# Patient Record
Sex: Female | Born: 1975
Health system: Southern US, Community
[De-identification: ages and names within clinical notes are randomized; demographics above are authoritative.]

## PROBLEM LIST (undated history)

## (undated) DIAGNOSIS — R112 Nausea with vomiting, unspecified: Secondary | ICD-10-CM

## (undated) DIAGNOSIS — Z9289 Personal history of other medical treatment: Secondary | ICD-10-CM

## (undated) DIAGNOSIS — I4719 Other supraventricular tachycardia: Secondary | ICD-10-CM

## (undated) DIAGNOSIS — K219 Gastro-esophageal reflux disease without esophagitis: Secondary | ICD-10-CM

## (undated) DIAGNOSIS — Z9889 Other specified postprocedural states: Secondary | ICD-10-CM

## (undated) DIAGNOSIS — K254 Chronic or unspecified gastric ulcer with hemorrhage: Secondary | ICD-10-CM

## (undated) DIAGNOSIS — D649 Anemia, unspecified: Secondary | ICD-10-CM

## (undated) DIAGNOSIS — G43909 Migraine, unspecified, not intractable, without status migrainosus: Secondary | ICD-10-CM

## (undated) DIAGNOSIS — N390 Urinary tract infection, site not specified: Secondary | ICD-10-CM

## (undated) DIAGNOSIS — I10 Essential (primary) hypertension: Secondary | ICD-10-CM

## (undated) DIAGNOSIS — I471 Supraventricular tachycardia: Secondary | ICD-10-CM

## (undated) HISTORY — PX: SALPINGOOPHORECTOMY: SHX82

## (undated) HISTORY — PX: CARPAL TUNNEL RELEASE: SHX101

## (undated) HISTORY — DX: Essential (primary) hypertension: I10

---

## 1998-12-30 HISTORY — PX: PELVIC LAPAROSCOPY: SHX162

## 2001-12-30 HISTORY — PX: TUBAL LIGATION: SHX77

## 2001-12-30 HISTORY — PX: ROUX-EN-Y GASTRIC BYPASS: SHX1104

## 2002-11-29 DIAGNOSIS — K254 Chronic or unspecified gastric ulcer with hemorrhage: Secondary | ICD-10-CM

## 2002-11-29 DIAGNOSIS — Z9289 Personal history of other medical treatment: Secondary | ICD-10-CM

## 2002-11-29 HISTORY — DX: Chronic or unspecified gastric ulcer with hemorrhage: K25.4

## 2002-11-29 HISTORY — DX: Personal history of other medical treatment: Z92.89

## 2004-04-13 ENCOUNTER — Other Ambulatory Visit: Admission: RE | Admit: 2004-04-13 | Discharge: 2004-04-13 | Payer: Self-pay | Admitting: Family Medicine

## 2005-04-17 ENCOUNTER — Other Ambulatory Visit: Admission: RE | Admit: 2005-04-17 | Discharge: 2005-04-17 | Payer: Self-pay | Admitting: Family Medicine

## 2005-06-27 ENCOUNTER — Emergency Department (HOSPITAL_COMMUNITY): Admission: EM | Admit: 2005-06-27 | Discharge: 2005-06-27 | Payer: Self-pay | Admitting: Emergency Medicine

## 2005-07-19 ENCOUNTER — Encounter (INDEPENDENT_AMBULATORY_CARE_PROVIDER_SITE_OTHER): Payer: Self-pay | Admitting: *Deleted

## 2005-07-19 ENCOUNTER — Ambulatory Visit (HOSPITAL_BASED_OUTPATIENT_CLINIC_OR_DEPARTMENT_OTHER): Admission: RE | Admit: 2005-07-19 | Discharge: 2005-07-19 | Payer: Self-pay | Admitting: Gynecology

## 2005-12-30 HISTORY — PX: VAGINAL HYSTERECTOMY: SUR661

## 2006-05-05 ENCOUNTER — Other Ambulatory Visit: Admission: RE | Admit: 2006-05-05 | Discharge: 2006-05-05 | Payer: Self-pay | Admitting: Gynecology

## 2006-07-03 ENCOUNTER — Encounter (INDEPENDENT_AMBULATORY_CARE_PROVIDER_SITE_OTHER): Payer: Self-pay | Admitting: Specialist

## 2006-07-03 ENCOUNTER — Ambulatory Visit (HOSPITAL_COMMUNITY): Admission: RE | Admit: 2006-07-03 | Discharge: 2006-07-04 | Payer: Self-pay | Admitting: Gynecology

## 2007-07-14 ENCOUNTER — Other Ambulatory Visit: Admission: RE | Admit: 2007-07-14 | Discharge: 2007-07-14 | Payer: Self-pay | Admitting: Gynecology

## 2008-08-03 ENCOUNTER — Ambulatory Visit (HOSPITAL_BASED_OUTPATIENT_CLINIC_OR_DEPARTMENT_OTHER): Admission: RE | Admit: 2008-08-03 | Discharge: 2008-08-03 | Payer: Self-pay | Admitting: Gynecology

## 2008-08-03 ENCOUNTER — Encounter: Payer: Self-pay | Admitting: Gynecology

## 2008-08-06 ENCOUNTER — Inpatient Hospital Stay (HOSPITAL_COMMUNITY): Admission: AD | Admit: 2008-08-06 | Discharge: 2008-08-06 | Payer: Self-pay | Admitting: Obstetrics & Gynecology

## 2008-11-15 ENCOUNTER — Encounter: Payer: Self-pay | Admitting: Gynecology

## 2008-11-15 ENCOUNTER — Ambulatory Visit: Payer: Self-pay | Admitting: Gynecology

## 2008-11-15 ENCOUNTER — Other Ambulatory Visit: Admission: RE | Admit: 2008-11-15 | Discharge: 2008-11-15 | Payer: Self-pay | Admitting: Gynecology

## 2009-11-29 ENCOUNTER — Encounter: Admission: RE | Admit: 2009-11-29 | Discharge: 2009-11-29 | Payer: Self-pay | Admitting: *Deleted

## 2009-12-12 ENCOUNTER — Other Ambulatory Visit: Admission: RE | Admit: 2009-12-12 | Discharge: 2009-12-12 | Payer: Self-pay | Admitting: Gynecology

## 2009-12-12 ENCOUNTER — Ambulatory Visit: Payer: Self-pay | Admitting: Gynecology

## 2010-10-25 ENCOUNTER — Ambulatory Visit: Payer: Self-pay | Admitting: Gynecology

## 2010-10-26 ENCOUNTER — Ambulatory Visit: Payer: Self-pay | Admitting: Gynecology

## 2010-12-13 ENCOUNTER — Other Ambulatory Visit
Admission: RE | Admit: 2010-12-13 | Discharge: 2010-12-13 | Payer: Self-pay | Source: Home / Self Care | Admitting: Gynecology

## 2010-12-13 ENCOUNTER — Ambulatory Visit: Payer: Self-pay | Admitting: Gynecology

## 2011-05-14 NOTE — H&P (Signed)
NAMEJAIRA, Latoya [name]            ACCOUNT NO.:  [id number]   MEDICAL RECORD NO.:  [id number]          PATIENT TYPE:  AMB   LOCATION:  [facility]                         FACILITY:  [facility]   PHYSICIAN:  Timothy P. Fontaine, M.D.DATE OF BIRTH:  21-Jun-1976   DATE OF ADMISSION:  08/03/2008  DATE OF DISCHARGE:                              HISTORY & PHYSICAL   CHIEF COMPLAINT:  Right lower quadrant pain.   HISTORY OF PRESENT ILLNESS:  A 35 year old G1, P44 female, status post  LAVH/LSO in the past,  separate laparoscopy for endometriosis with  persistent right pelvic pain.  Outpatient evaluation including  ultrasound shows cystic changes suggestive of endometriosis.  Options  for management were discussed to include observation, hormonal  manipulation to include suppressive therapy such as low-dose oral  contraceptives, Depo-Provera, Depo-Lupron, as well as surgical to  include laparoscopy,  ablation of visible endometriosis, conservative  removal of ovarian cystic areas but leaving normal-appearing ovarian  tissue versus proceeding with a laparoscopic oophorectomy.  The patient  wants to proceed with a laparoscopic oophorectomy.  She understands that  it is removing her only remaining ovary.  She understands and accepts  this and is admitted for laparoscopic oophorectomy.   PAST MEDICAL HISTORY:  None.   PAST SURGICAL HISTORY:  Includes gastric bypass in 2003, tubal  sterilization 2003, cesarean section, laparoscopy for endometriosis,  LAVH/LSO for endometriosis, carpal tunnel surgery.   ALLERGIES:  CODEINE CAUSES ITCHING.   CURRENT MEDICATIONS:  Nexium 40 mg daily, Zomig 25 mg p.r.n. headache,  multivitamins, iron.   REVIEW OF SYSTEMS:  Noncontributory.   FAMILY HISTORY:  Noncontributory.   SOCIAL HISTORY:  Significant for tobacco use.   ADMISSION PHYSICAL EXAMINATION:  VITAL SIGNS:  Afebrile.  Vital signs  are stable.  HEENT:  Normal.  LUNGS:  Clear.  CARDIAC:  Regular rate  without rubs, murmurs or gallops.  ABDOMINAL:  Exam benign.  PELVIC:  External, BUS, vagina normal.  Bimanual without masses or  tenderness.  Rectovaginal exam confirms.   ASSESSMENT:  A 35 year old G1, P51 female, status post laparoscopic-  assisted vaginal hysterectomy/left salpingoophorectomy for  endometriosis, persistent right-sided pain, ultrasound suggestive of  endometrioma measuring 25 x 22 mm for laparoscopic oophorectomy.  The  risks, benefits, indications, and alternatives for the surgery were  reviewed with the patient, to include attempts at conservative  management such as pain management, hormonal suppressive therapy, low-  dose oral contraceptives, Depo-Provera, Depo-Lupron, conservative  surgical management such as laparoscopic ablation of endometriosis,  removal of endometrioma with leaving remaining viable ovarian tissue  versus proceeding with laparoscopic oophorectomy.  The patient wants to  proceed with definitive therapy to include laparoscopic salpingo-  oophorectomy.  She understands that this will leave her hypoestrogenic  with the removal of her only remaining ovary, and the options and issues  with these were reviewed.  She understands the issues of low estrogen,  to include bone health, cardiovascular risk and symptoms, and the option  for estrogen replacement therapy was discussed with her.  The women's  health initiative statistics were reviewed, potential increased risk for  stroke, heart attack, deep venous  thrombosis as well as breast cancer  risk discussed with her.  After a lengthy discussion, she wants a  laparoscopic oophorectomy and will plan on estrogen replacement therapy.  Transdermal versus oral routes discussed and we tentatively plan on a  transdermal route.  The patient understands that it may be difficult in  estrogen replacement therapy to control her symptoms.  She also  understands that by removing this ovary there are no guarantees of  pain  relief, that her pain may persist, worsen or change following the  procedure, and she clearly understands and accepts this.  The acute  intraoperative, postoperative risks of the surgery were reviewed with  her.  She understands that due to her prior surgeries a laparoscopic  removal may not be possible and that at any time during the procedure I  may have to convert to an open case with a larger incision, and she  understands and accepts this.  The risks of infection requiring  prolonged antibiotics, abscess formation with reoperation, abscess  drainage or hematoma drainage discussed as well as incisional  complications requiring opening and draining of incisions, closure by  secondary intention, long-term issues as far as cosmetics as well as  hernia formation were discussed with her.  The risks of bleeding leading  to hemorrhage necessitating transfusion, the risk of transfusion  discussed, to include transfusion reaction, hepatitis, HIV, mad cow  disease and other unknown entities.  The risks of inadvertent injury to  internal organs, including bowel, bladder, ureters, vessels and nerves  necessitating major exploratory reparative surgeries, future reparative  surgeries, bowel resection, bladder repair,  ureteral damage repair,  ostomy formation were all discussed, understood and accepted.  Her last  laparoscopic surgery showed a relatively clean pelvis, but I have asked  her to prepare with a bowel preparation and antibiotic prophylaxis in  the event that a bowel injury occurs that may allow for direct closure.  The patient's questions were answered to her satisfaction.  She clearly  understands the situation and wants to proceed with surgery.      Timothy P. Fontaine, M.D.  Electronically Signed     TPF/MEDQ  D:  08/02/2008  T:  08/02/2008  Job:  [id number]

## 2011-05-14 NOTE — Op Note (Signed)
Latoya [name], [name]            ACCOUNT NO.:  [id number]   MEDICAL RECORD NO.:  [id number]          PATIENT TYPE:  AMB   LOCATION:  [facility]                         FACILITY:  [facility]   PHYSICIAN:  Latoya [name], M.D.DATE OF BIRTH:  April 15, 1976   DATE OF PROCEDURE:  08/03/2008  DATE OF DISCHARGE:                               OPERATIVE REPORT   PREOPERATIVE DIAGNOSES:  1. History of endometriosis.  2. Right lower quadrant pain.  3. Cystic right ovary, suspicious for endometrioma.   POSTOPERATIVE DIAGNOSES:  1. History of endometriosis.  2. Right lower quadrant pain.  3. Cystic right ovary, suspicious for endometrioma.   PROCEDURE:  Laparoscopic right salpingo-oophorectomy.   SURGEON:  Latoya [name], M.D.   ASSISTANT:  Dr. Eda Paschal.   ANESTHETIC:  General with 2% Marcaine incisional postoperative  injection.   SPECIMEN:  1. Opening cell washing.  2. Right fallopian tube segment and ovary to pathology.   COMPLICATIONS:  None.   ESTIMATED BLOOD LOSS:  Minimal.   FINDINGS:  EUA, external BUS, vagina normal.  Bimanual without masses.  Surgical laparoscopic pelvis without evidence of active endometriosis or  adhesions.  Left pelvic sidewall grossly normal.  There was evidence of  superficial white peritoneal scarring throughout the pelvis, suggestive  of her prior surgeries, although no evidence of active endometriosis.  Right fallopian tube segment and ovary with cystic ovarian changes,  grossly normal in size, free and mobile.  Small pigmented discoloration  on the capsule, suggestive of endometriotic implant.  Right ovary and  fallopian tube segment removed in its entirety.  Right pelvic sidewall  otherwise normal.  Ureter identified and away from the surgical site.  Upper abdominal exam; appendix grossly normal, free and mobile.  No  upper abdominal adhesions noted.  The liver is smooth, no gross  abnormalities.  Gallbladder generous in size, otherwise normal  in  appearance.   DESCRIPTION OF PROCEDURE:  The patient was taken to the operating room  and underwent general anesthesia.  She was placed in the low dorsal  lithotomy position, received an abdominoperineal vaginal preparation  with Betadine solution.  EUA performed.  Bladder emptied with an  indwelling Foley catheter placed in sterile technique and a sponge stick  placed within the vagina for manipulation during the procedure.  The  patient was draped in the usual fashion.  A vertical infraumbilical  incision was made through a prior incision site and the abdomen was  entered directly using the 10-mm OptiView direct entry trocar.  The  abdomen was then insufflated.  Right and left 5-mm suprapubic ports were  then placed under direct visualization after transillumination for the  vessels without difficulty.  Examination of the pelvic organs, upper  abdominal exam was carried out with findings noted above.  An opening  cell washing was then obtained from the pelvis was sent to pathology.  The right fallopian tube segment and ovary were elevated.  The right  pelvic sidewall inspected.  The ureter found to be away from the  surgical site and using the harmonic scalpel, the infundibulopelvic  ligament was transected without difficulty and the ovary  ultimately  freed through transection of the remaining peritoneal attachments.  The  10-mm laparoscope was replaced with an Endopouch.  A 5-mm laparoscope  placed through the suprapubic port and the ovary was placed within the  pouch and subsequently removed through the infraumbilical port site.  The 10-mm laparoscope was replaced.  The abdomen was copiously  irrigated.  Hemostasis was visualized at the surgical site.  The gas was  slowly allowed to escape.  Hemostasis was maintained, and subsequently  the right and left suprapubic 5 mm ports were removed under direct  visualization, showing adequate hemostasis.  The infraumbilical port was   then backed out under direct visualization showing adequate hemostasis.  No evidence of hernia formation.  The infraumbilical fascia was closed  using 0 Vicryl suture in a running stitch.  A 3-0 plain interrupted  suture was placed to close the subcutaneous tissues and all skin  incisions were then injected using 0.25% Marcaine and subsequently  closed using Dermabond skin adhesive.  The patient was placed in a  supine position after removal of the vaginal sponge stick, awakened  without difficulty and taken to recovery room in good condition after  having received Toradol intraoperatively.      Latoya [name], M.D.  Electronically Signed     TPF/MEDQ  D:  08/03/2008  T:  08/03/2008  Job:  [id number]

## 2011-05-14 NOTE — Consult Note (Signed)
[name], [name]            ACCOUNT NO.:  [id number]   MEDICAL RECORD NO.:  [id number]          PATIENT TYPE:  MAT   LOCATION:  [facility]                          FACILITY:  [facility]   PHYSICIAN:  M. Leda Quail, MD  DATE OF BIRTH:  1976-12-26   DATE OF CONSULTATION:  08/06/2008  DATE OF DISCHARGE:  08/06/2008                                 CONSULTATION   CHIEF COMPLAINT:  Open incision.   HISTORY OF PRESENT ILLNESS:  Ms. Latoya [name] is a very nice 35 year old  white female who underwent a laparoscopic RSO with Dr. Colin Broach  on Wednesday, August 03, 2008.  She called him yesterday and was seen in  the office because the inferior portion of her incision had opened a  little bit.  She called me last night about midnight reporting that the  incision felt like it was all the way open and she could see into the  incision.  She did not want to come to the emergency room last night.  She reports that there was really no drainage and minimal blood from the  incision.  She had a piece of gauze on it, and there was no surrounding  redness on the skin.  Therefore, we decided to come to the hospital this  morning and have me look at it and see what kind of dressing it needed.  She does report that she is experiencing more pain from this surgery  than she has in previous surgeries.  She is not sure that is because of  the incision issues or because she has had multiple incisions now  through her belly button.  She reports no fever.  She is having regular  bowel movements.  She is basically is only taking the pain medicine at  night, and she has no active drainage or odor from the incision.   PAST MEDICAL HISTORY:  Significant for:  1. Endometriosis.  2. Right lower quadrant pain.  3. History of gastric ulcers.   PAST SURGICAL HISTORY:  RSO on August, 5, 2009.  She had a laparoscopic-  assisted vaginal hysterectomy with LSO on July 03, 2006, secondary to  menorrhagia, dysmenorrhea, and  dyspareunia as well as endometriosis.  She had a laparoscopy with fulguration of endometriosis on July 19, 2005.  About 6 years ago, she had a laparoscopic gastric bypass.   SOCIAL HISTORY:  She does smoke.  She denies drug use.  She has social  alcohol use.  She does have a boyfriend who she lives with.   ALLERGIES:  CODEINE causes itching.   MEDICATIONS:  She is on medication for reflux and her gastric bypass,  vitamins, and pain medicine as needed after this surgery.   PHYSICAL EXAMINATION:  VITAL SIGNS:  She is afebrile.  Other vital signs  were stable.  GENERAL:  Well-nourished and well-developed white female, in no acute  distress.  ABDOMEN:  Soft, nontender, and nondistended.  Normal bowel sounds are  present.  Two inferior incisions have some bruising around them, but  otherwise are clean, dry, and intact with Dermabond on them.  At the  umbilical  incision, there is a little bit of blood and drainage from the  incision and the Dermabond appears loose.  The Dermabond is removed off  the incision and the incision was cleansed with sterile saline and  alcohol.  The incision was actually only open inferiorly and not  significantly.  It is not open, although it just red right at the  incision site, so that is what the patient thought was making the  incision look opened.  After the incision was cleansed and dried, Steri-  Strips were applied to it.  There is no surrounding erythema and the  incision does otherwise look really good.  There is no evidence of  infection with no purulent drainage.  She has only had tiny amount of  blood and a little bit of a serous drainage.   The patient also has some stitches in her right fifth digit that she is  going to go to Urgent Care and have removed, and I went ahead and  removed them without difficulty and Steri-Striped those incisions as  well.   ASSESSMENT AND PLAN:  Minimal opening of laparoscopic umbilical  incision.   The area is  dressed with gauze and the patient is encouraged to shower  today, redress it after showering and not to remove the dressing until  tomorrow, after that she should be fine, and she will plan to contact  Dr. Audie Box early next week if she has any issues and she is welcome to  call me back this weekend if she has any issues.  She does have my  contact information.  She was given precautions for calling if she has  any fever, increasing redness around the incision, worsening drainage,  or odor from the incision.  She does have a followup appointment with  Dr. Audie Box, and we will keep that appointment unless or other issues.       Lum Keas, MD  Electronically Signed     MSM/MEDQ  D:  08/06/2008  T:  08/06/2008  Job:  [id number]   cc:   Marcial Pacas P. Fontaine, M.D.  Fax: [phone]

## 2011-05-17 NOTE — Op Note (Signed)
[name], [name]            ACCOUNT NO.:  [id number]   MEDICAL RECORD NO.:  [id number]          PATIENT TYPE:  OIB   LOCATION:  9399                          FACILITY:  [facility]   PHYSICIAN:  Timothy P. Fontaine, M.D.DATE OF BIRTH:  04/03/1976   DATE OF PROCEDURE:  07/03/2006  DATE OF DISCHARGE:                                 OPERATIVE REPORT   PREOPERATIVE DIAGNOSES:  1.  Menorrhagia.  2.  Dysmenorrhea.  3.  Dyspareunia.   POSTOPERATIVE DIAGNOSES:  1.  Menorrhagia.  2.  Dysmenorrhea.  3.  Dyspareunia.   PROCEDURE:  1.  Laparoscopically assisted vaginal hysterectomy.  2.  Left left salpingo-oophorectomy.   SURGEON:  Timothy P. Fontaine, M.D.   ASSISTANTGaetano Hawthorne. Lily Peer, M.D.   ANESTHETIC:  General endotracheal.   COMPLICATIONS:  None.   SPECIMEN:  Uterus, left fallopian tube and left ovary.   ESTIMATED BLOOD LOSS:  Less than 100 mL.   FINDINGS:  Anterior cul-de-sac normal.  Posterior cul-de-sac normal.  Uterus  normal size, shape, and contour.  Right and left fallopian tubes of normal  length, caliber and fimbriated ends.  Right and left ovaries grossly normal,  free and mobile.  Upper abdominal exam grossly normal, noting liver,  gallbladder, appendix all visualized and normal.  No upper abdominal  adhesions or any abdominopelvic adhesions noted.   PROCEDURE:  The patient was taken to the operating room, underwent general  endotracheal anesthesia, was placed in the low dorsal lithotomy position,  received abdominal, perineal and vaginal preparation with Betadine solution  per nursing personnel, bladder emptied with an in-and-out Foley  catheterization, EUA performed, Hulka tenaculum placed, patient draped in  the usual fashion.  A vertical infraumbilical incision was made and using  the OptiVu trocar, initial attempt at abdominal entry was made.  With the  initial attempt, it was difficult to tell whether we were supraperitoneal or  whether there were  significant abdominal adhesions and it was decided to  abandon this approach and to convert to an open approach.  The abdomen was  then sharply entered.  Subsequently, under direct visualization without  difficulty, the Hasson cannula was then placed and sutured in position.  Insufflation of the abdomen was then carried out and observation revealed a  clean abdomen without adhesive disease and a normal-appearing pelvis.  Right  and left 5-mm suprapubic ports were then placed under direct visualization  after transillumination for the vessels without difficulty.  The uterus was  then elevated.  The uterine-ovarian pedicle on the right was identified,  subsequently bipolar-cauterized in several passes and then sharply incised.  The right round ligament was bipolar-cauterized and incised and the  parametrial tissues were likewise skeletonized, bipolar-cauterized and  incised.  The right uterine vessel was identified, bipolar-cauterized times  several passes and incised.  The anterior vesicouterine-peritoneal fold was  then sharply incised to the midline.  Attention was then directed to the  left pelvis.  The uterus again was elevated.  The left infundibulopelvic  ligament and vessels were identified.  The ureter was likewise identified  away from the operative field and the pedicle  was bipolar-cauterized times  several passes and sharply incised.  The left adnexa was freed from its  peritoneal attachments through bipolar cauterization and incision to the  level of the left parametrial area.  The left round ligament was then  bipolar-cauterized and incised, the left parametrial tissues skeletonized,  bipolar-cauterized and incised.  The left uterine artery was likewise  identified, bipolar-cauterized times several passes and incised.  The  anterior vesicouterine-peritoneal fold was then sharply incised to meet the  incision from the right.  Attention was then turned to the vaginal portion  of  this case.  The patient was placed in the high dorsal lithotomy position,  the Hulka tenaculum removed, the cervix visualized with a weighted speculum  and retractors and the cervical mucosa was then circumferentially injected  using 1% lidocaine with epinephrine 1:100,000, a total of 10 mL.  The  cervical mucosa was then sharply incised circumferentially and paracervical  plane sharply developed.  The vesicouterine plane was then developed and  subsequently the posterior cul-de-sac was sharply entered and a long  weighted speculum placed.  The right and left uterosacral ligaments were  identified, clamped, cut and ligated using 0 Vicryl suture and tagged for  future reference.  The uterus was progressively freed from its attachments  through clamping, cutting and ligating of the cardinal ligaments,  paracervical and parametrial tissues using 0 Vicryl suture and subsequently  the anterior cul-de-sac was bluntly entered and the uterus was delivered  through the vagina, the remaining tissues clamped and cut, freeing the  specimen.  The remaining pedicles were ligated using 0 Vicryl suture.  The  long weighted speculum was replaced by a shorter weighted speculum, the  posterior vaginal cuff identified, grasped with an Allis clamps, intestines  packed using a tagged tail sponge and the pelvis was irrigated, noting  adequate hemostasis.  The posterior vaginal cuff was then run from  uterosacral ligament to uterosacral ligament using 0 Vicryl suture in a  running interlocking stitch, the bowel packing removed and subsequently the  vagina closed anterior to posterior using 0 Vicryl suture in an interrupted  figure-of-eight stitch.  The vagina was irrigated, adequate hemostasis  visualized, an indwelling Foley catheter placed in sterile technique, noting  clear yellow urine free-flowing.  The surgeons regloved and attention was then turned to the laparoscopic portion.  The abdomen was  reinsufflated,  pelvis copiously irrigated, all pedicles reinspected, showing adequate  hemostasis.  The right and left suprapubic ports were removed under direct  visualization, the gas slowly allowed to escape; all port sites and pelvic  pedicles were reinspected under a low-pressure situation, showing adequate  hemostasis.  The infraumbilical port was then removed.  The fascia was  identified and subsequently closed using 0 Vicryl suture in a running  stitch.  The skin was then reapproximated using 4-0 Monocryl in a running  subcuticular stitch and a Dermabond skin adhesive was placed over the  overlying skin, as well as closing the 5-mm suprapubic ports with the  Dermabond skin adhesive.  The patient was placed in the supine position,  awakened without difficulty and taken to the recovery room in good  condition, having tolerated her procedure well.      Timothy P. Fontaine, M.D.  Electronically Signed     TPF/MEDQ  D:  07/03/2006  T:  07/03/2006  Job:  [id number]

## 2011-05-17 NOTE — H&P (Signed)
Latoya [name], Latoya [name]            ACCOUNT NO.:  [id number]   MEDICAL RECORD NO.:  [id number]          PATIENT TYPE:  AMB   LOCATION:  [facility]                         FACILITY:  [facility]   PHYSICIAN:  Timothy P. Fontaine, M.D.DATE OF BIRTH:  [birth year]   DATE OF ADMISSION:  DATE OF DISCHARGE:                                HISTORY & PHYSICAL   CHIEF COMPLAINT:  Increasing dysmenorrhea, dyspareunia.   HISTORY OF PRESENT ILLNESS:  A 35 year old G1, P40 female who is status post  laparoscopic tubal sterilization with a history of increasing dysmenorrhea,  irregular menses, and dyspareunia.  The patient notes that she has done well  until the beginning of this year, when her menses have become heavier.  She  has had more pain and cramping as well as some left-sided pain with her  periods.  She also notes deep dyspareunia with intercourse, and she does  have a family history of endometriosis.  Patient is admitted at this time  for laser laparoscopy.   PAST MEDICAL HISTORY:  The patient is being treated for ulcer prevention as  well as for migraines.   CURRENT MEDICATIONS:  1.  Nexium 40 mg per day.  2.  Zomig 25 mg p.r.n.  3.  Iron, B12, multivitamins, and calcium.   PAST SURGICAL HISTORY:  1.  Significant for gastric bypass surgery in January, 2003.  2.  Cesarean section.  3.  Carpal tunnel syndrome.  4.  Laparoscopic tubal sterilization, March, 2003.   ALLERGIES:  CODEINE.   REVIEW OF SYSTEMS:  Noncontributory.   SOCIAL HISTORY:  Noncontributory.   PHYSICAL EXAMINATION:  VITAL SIGNS:  Afebrile.  Vital signs are stable.  HEENT:  Normal.  LUNGS:  Regular rate and rhythm without murmurs, rubs or gallops.  ABDOMEN:  Benign.  PELVIC:  External BUS, vagina are normal.  Cervix normal.  Uterus normal  size.  Midline, mobile, nontender.  Adnexa without masses or tenderness.   ASSESSMENT:  A 35 year old G1, P28 female status post tubal sterilization  with increasing dysmenorrhea,  dyspareunia, and some irregularity in her  menstrual cycles.  Patient's evaluation included a normal prolactin, FSH,  and thyroid panel with a normal hemoglobin at 12.7.  The patient had a  sonohystogram, which showed normal ovaries bilaterally and a normal  endometrial cavity.  The patient is admitted at this time for laser  laparoscopy due to her pain.  The proposed surgery, expected intraoperative  and postoperative courses were reviewed with the patient.  She understands  there are no guarantees as far as pain relief is concerned, but her pain may  persist, worsen, or change following the procedure.  She understands and  accepts this.  Her expected intraoperative course was reviewed, to include  insufflation, trocar placement, multiple port sites.  The use of sharpened  dissection, electrocautery, and laser was all discussed with her.  She  understands that we will try to eradicate disease as best we can, as  encountered, that I may not address disease if felt unsafe to do so during  the disease, I might encounter no disease, and that she may have a negative  laparoscopy.  The risks of the procedure were discussed, including major  vessel injury leading to bleeding and hemorrhage, necessitating transfusion.  The risks of transfusion were reviewed, understood, and accepted.  The risk  of infection, both internal, requiring prolonged antibiotics, as well as  incisional, including opening and draining of incisions, were all discussed,  understood, and accepted.  The risks of inadvertant injury to internal  organs, including bowel, bladder, ureters, vessels and nerves, necessitating  major exploratory reparative surgeries and future reparative surgeries,  including ostotomy formation, was all discussed, understood, and accepted.  The patient has had prior surgeries.  She understands there is an increased  risk for scarring and anatomy distortion and the risk of injury to internal  organs  increases.  She did have a laparoscopy historically following her  gastric bypass surgery.  She did not have any problem with the laparoscopic  tubal following the bypass surgery.  The patient's questions were answered  to her satisfaction.  She is ready to proceed with surgery.       TPF/MEDQ  D:  07/16/2005  T:  07/16/2005  Job:  [id number]

## 2011-05-17 NOTE — Op Note (Signed)
NAMERYONNA, Latoya [name]            ACCOUNT NO.:  [id number]   MEDICAL RECORD NO.:  [id number]          PATIENT TYPE:  AMB   LOCATION:  [facility]                         FACILITY:  [facility]   PHYSICIAN:  Timothy P. Fontaine, M.D.DATE OF BIRTH:  13-Mar-1976   DATE OF PROCEDURE:  07/19/2005  DATE OF DISCHARGE:                                 OPERATIVE REPORT   PREOPERATIVE DIAGNOSIS:  Pelvic pain.   POSTOPERATIVE DIAGNOSIS:  Endometriosis.   PROCEDURE:  Laparoscopic biopsy, fulguration endometriosis.   SURGEON:  Colin Broach, MD   ANESTHETIC:  General endotracheal.   COMPLICATIONS:  None.   ESTIMATED BLOOD LOSS:  Minimal.   SPECIMEN:  Peritoneal biopsy x2.   FINDINGS:  Anterior cul-de-sac normal, posterior cul-de-sac with two white  fibrotic areas consistent with white endometriosis. Right uterosacral  ligament, one area white fibrotic changes consistent with endometriosis left  uterosacral ligament. Fibrotic implant right upper left pelvic sidewall,  uterus grossly normal size, shape and contour,  right and left fallopian  tubes grossly normal with absent mid tubal segments consistent with history  of prior tubal sterilization. Right and left ovaries grossly normal, free  and mobile. Appendix grossly normal, free and mobile. Upper abdominal exam,  liver smooth, no abnormalities, gallbladder mildly distended, grossly  normal, no evidence of abdominal adhesions from prior surgeries.  Representative areas of endometriosis were biopsied, subsequently all  visible areas were fulgurated.   DESCRIPTION OF PROCEDURE:  The patient was taken to the operating room,  underwent general anesthesia and was placed in low dorsal lithotomy  position, received an abdominal perineal vaginal preparation with Betadine  scrub and Betadine solution. Bladder emptied with in-and-out Foley  catheterization. EUA performed, hulka tenaculum placed on the cervix. The  patient was draped in the usual fashion.  An infraumbilical vertical incision  was made and using the bladeless was OptiView trocar, the 10 mm laparoscopic  trocar was placed within the abdominal cavity under direct visualization  without difficulty. Insufflation ensued and subsequently a 5 mm midline  suprapubic port was placed without difficulty under direct visualization  after transillumination for the vessels. An upper abdominal exam was carried  out with findings noted above. Two representative biopsies of the suspected  endometriosis were taken, one from the right uterosacral ligament and the  other from the left upper pelvic sidewall. The ureters were identified  bilaterally and were noted to be away from the operative sites. Subsequently  all visible areas were bipolar cauterized without difficulty. The gas was  then allowed to escape, biopsy sites inspected under low pressure situation  showing adequate hemostasis. The suprapubic port removed showing adequate  hemostasis and the infraumbilical port was backed out under direct  visualization showing adequate hemostasis and no evidence of hernia  formation. A 0 Vicryl interrupted subcutaneous fascial stitch was placed  infraumbilically. The skin incisions  were injected using 0.25% Marcaine and both skin incisions closed using  Dermabond skin adhesive. The hulka tenaculum was removed, patient placed in  supine position, awakened without difficulty, taken to recovery room in good  condition having tolerated her procedure well.  TPF/MEDQ  D:  07/19/2005  T:  07/19/2005  Job:  [id number]

## 2011-05-17 NOTE — H&P (Signed)
Latoya [name], Latoya [name]            ACCOUNT NO.:  [id number]   MEDICAL RECORD NO.:  [id number]          PATIENT TYPE:  AMB   LOCATION:  [facility]                           FACILITY:  [facility]   PHYSICIAN:  Timothy P. Fontaine, M.D.DATE OF BIRTH:  1976-03-16   DATE OF ADMISSION:  07/03/2006  DATE OF DISCHARGE:                                HISTORY & PHYSICAL   CHIEF COMPLAINT:  Increasing menorrhagia, dysmenorrhea, dyspareunia.   HISTORY OF PRESENT ILLNESS:  A 35 year old, G1, P0 female status post  laparoscopic tubal sterilization, past history of laparoscopic proven  endometriosis with increasing dysmenorrhea, menorrhagia, pelvic pain  primarily on the left.  Patient has a normal ultrasound and normal physical  exam. The options for management were reviewed to include hormonal  manipulation, Depot Lupron, oral contraceptive, suppression as well as  surgical reevaluation both conservative and radical all of which was  discussed and understood and the patient wants to proceed with hysterectomy.  Given her persistent left-sided pain with intercourse, exam and  spontaneously, she would prefer to have her left tube and ovary removed, but  if her right tube and ovary are normal in appearance she would prefer to  retain these. The patient is admitted at this time for a laparoscopic  LAVH/LSO.   PAST MEDICAL HISTORY:  Migraines.   PAST SURGICAL HISTORY:  Cesarean section, gastric bypass, separate tubal  sterilization, separate laparoscopy.   CURRENT MEDICATIONS:  Nexium, multivitamin and Zomig p.r.n. for her  migraines.   ALLERGIES:  CODEINE.   REVIEW OF SYSTEMS:  Noncontributory.   FAMILY HISTORY:  Noncontributory.   SOCIAL HISTORY:  Noncontributory.   PHYSICAL EXAM:  VITAL SIGNS:  Afebrile. Vital are signs stable.  HEENT:  Normal.  LUNGS:  Clear.  CARDIAC:  Regular rate, no rubs, murmurs or gallops.  ABDOMEN:  Abdominal exam benign.  PELVIC:  External, BUS, vagina normal.  Cervix  grossly normal. Tenaculum  test shows some descent of the uterus. Uterus grossly normal in size, shape,  contour, midline and mobile.  Adnexa without masses or tenderness.   ASSESSMENT:  A 35 year old G1, P45 female status post cesarean section,  separate laparoscopic tubal sterilization for LAVH/LSO. I reviewed with the  patient the risks, benefits, indications and alternatives for the proposed  surgery to include absolute irreversible sterility associated with  hysterectomy of which she understands and accepts.  Sexuality following  hysterectomy was also reviewed. The potential for persistent orgasmic  dysfunction as well as persistent dyspareunia was discussed, understood, and  accepted.  The ovarian conservation issue ws also reviewed and the issues as  to keep both ovaries or remove both ovaries were discussed.  Since she is  having continued persistent pain on the left, we will remove her left tube  and ovary, but she desires to keep her right tube and ovary if they appear  normal for continued hormone production.  She understands by keeping the  ovary that she will have the risk of ovarian cancer in the future as well as  a realistic issue with a history of endometriosis, returned pain,  endometriosis, benign ovarian problems that would  require reoperation in the  future.  She does also have a very realistic understanding and desire that  if her right ovary is diseased that she would prefer that I remove both  ovaries and she would entertain the issues of hormone replacement therapy  following the procedure.  The patient understands that if at anytime during  the procedure, it is felt unsafe to proceed with the vaginal route or  complications arise that we may switch to the abdominal route. She  understands and accepts this.  Acute intraoperative postoperative risks were  reviewed to include the risk of infection, both internal requiring prolonged  antibiotics, incisional requiring  opening and draining of incisions, closure  by secondary intention as well as the risk of cuff abscess or cuff hematoma  that would require reoperation and drainage.  The risks of hemorrhage  necessitating transfusion and the risks of transfusion were reviewed to  include transfusion reaction, hepatitis, HIV, mad cow disease and other  unknown entities.  The patient asked about the options for autologous blood  donation, I reviewed with her that certainly I can not guarantee her that we  would not have to transfuse and that I offered her this option but  realistically the likelihood is low and she declines otologist blood  donation.  The risk of inadvertent injury to internal organs including  bowel, bladder, ureters, vessels and nerves necessitating major reparative  surgeries and future reparative surgeries including ostomy formation was all  discussed, understood and accepted.  She does have a history of gastric  bypass although they did do a tubal sterilization following this and  apparently there was not an issue with this but she understands both from  that prior abdominal surgery as well as her cesarean section with bladder  scarring potential that there is an increased risk for damage both to  intestine and bladder and she understands and accepts this.  The patient's  questions were answered to her satisfaction and she is ready to proceed with  surgery.      Timothy P. Fontaine, M.D.  Electronically Signed     TPF/MEDQ  D:  06/23/2006  T:  06/23/2006  Job:  [id number]

## 2011-05-17 NOTE — Discharge Summary (Signed)
Latoya [name], Latoya [name]            ACCOUNT NO.:  [id number]   MEDICAL RECORD NO.:  [id number]          PATIENT TYPE:  OIB   LOCATION:  9303                          FACILITY:  [facility]   PHYSICIAN:  Timothy P. Fontaine, M.D.DATE OF BIRTH:  [birth year]   DATE OF ADMISSION:  07/03/2006  DATE OF DISCHARGE:  07/04/2006                                 DISCHARGE SUMMARY   DIAGNOSIS:  menorragia, dysmenorehia, dysparunia   PROCEDURE:  Laparoscopic assisted vaginal hysterectomy, left salpingo-  oophorectomy July 03, 2006, Dr. Colin Broach   Pathology pending.   HOSPITAL COURSE:  A 35 year old female underwent uncomplicated LAVH/LSO July 03, 2006.  Her postoperative recovery has been uncomplicated and she was  discharged on postoperative day #1 ambulating well, tolerating a regular  diet, voiding without difficulty with a postoperative hemoglobin of 11.2.  The patient received precautions, instructions, and follow-up.  Will be seen  in the office in two weeks following discharge and received a prescription  for Tylox #20 one to two p.o. q.4-6h. p.r.n. pain.      Timothy P. Fontaine, M.D.  Electronically Signed     TPF/MEDQ  D:  07/04/2006  T:  07/04/2006  Job:  [id number]

## 2012-01-08 ENCOUNTER — Other Ambulatory Visit: Payer: Self-pay | Admitting: Gynecology

## 2012-02-13 ENCOUNTER — Encounter: Payer: Self-pay | Admitting: Gynecology

## 2012-02-13 ENCOUNTER — Ambulatory Visit (INDEPENDENT_AMBULATORY_CARE_PROVIDER_SITE_OTHER): Payer: Managed Care, Other (non HMO) | Admitting: Gynecology

## 2012-02-13 VITALS — BP 128/78 | Ht 66.5 in | Wt 162.0 lb

## 2012-02-13 DIAGNOSIS — Z13220 Encounter for screening for lipoid disorders: Secondary | ICD-10-CM

## 2012-02-13 DIAGNOSIS — Z01419 Encounter for gynecological examination (general) (routine) without abnormal findings: Secondary | ICD-10-CM

## 2012-02-13 DIAGNOSIS — R1031 Right lower quadrant pain: Secondary | ICD-10-CM

## 2012-02-13 DIAGNOSIS — Z79890 Hormone replacement therapy (postmenopausal): Secondary | ICD-10-CM

## 2012-02-13 DIAGNOSIS — Z131 Encounter for screening for diabetes mellitus: Secondary | ICD-10-CM

## 2012-02-13 LAB — URINALYSIS W MICROSCOPIC + REFLEX CULTURE
Hgb urine dipstick: NEGATIVE
Ketones, ur: NEGATIVE mg/dL
Leukocytes, UA: NEGATIVE
Protein, ur: NEGATIVE mg/dL

## 2012-02-13 MED ORDER — ESTRADIOL 1 MG PO TABS: 1.0000 mg | ORAL_TABLET | Freq: Every day | ORAL | Status: DC

## 2012-02-13 NOTE — Patient Instructions (Signed)
Follow up for annual gynecologic exam.  Consider Stop Smoking.  Help is available at [facility] smoking cessation program @ [email or web address].com or 408-833-7679. OR 1-800-QUIT-NOW 6514775265) for free smoking cessation counseling.   Smoking Hazards Smoking cigarettes is extremely bad for your health. Tobacco smoke has over 200 known poisons in it. There are over 60 chemicals in tobacco smoke that cause cancer. Some of the chemicals found in cigarette smoke include:  Cyanide.  Benzene.  Formaldehyde.  Methanol (wood alcohol).  Acetylene (fuel used in welding torches).  Ammonia.  Cigarette smoke also contains the poisonous gases nitrogen oxide and carbon monoxide.  Cigarette smokers have an increased risk of many serious medical problems, including: Lung cancer.  Lung disease (such as pneumonia, bronchitis, and emphysema).  Heart attack and chest pain due to the heart not getting enough oxygen (angina).  Heart disease and peripheral blood vessel disease.  Hypertension.  Stroke.  Oral cancer (cancer of the lip, mouth, or voice box).  Bladder cancer.  Pancreatic cancer.  Cervical cancer.  Pregnancy complications, including premature birth.  Low birthweight babies.  Early menopause.  Lower estrogen level for women.  Infertility.  Facial wrinkles.  Blindness.  Increased risk of broken bones (fractures).  Senile dementia.  Stillbirths and smaller newborn babies, birth defects, and genetic damage to sperm.  Stomach ulcers and internal bleeding.  Children of smokers have an increased risk of the following, because of secondhand smoke exposure:  Sudden infant death syndrome (SIDS).  Respiratory infections.  Lung cancer.  Heart disease.  Ear infections.  Smoking causes approximately: 90% of all lung cancer deaths in men.  80% of all lung cancer deaths in women.  90% of deaths from chronic obstructive lung disease.  Compared with nonsmokers, smoking increases the risk  of: Coronary heart disease by 2 to 4 times.  Stroke by 2 to 4 times.  Men developing lung cancer by 23 times.  Women developing lung cancer by 13 times.  Dying from chronic obstructive lung diseases by 12 times.  Someone who smokes 2 packs a day loses about 8 years of his or her life. Even smoking lightly shortens your life expectancy by several years. You can greatly reduce the risk of medical problems for you and your family by stopping now. Smoking is the most preventable cause of death and disease in our society. Within days of quitting smoking, your circulation returns to normal, you decrease the risk of having a heart attack, and your lung capacity improves. There may be some increased phlegm in the first few days after quitting, and it may take months for your lungs to clear up completely. Quitting for 10 years cuts your lung cancer risk to almost that of a nonsmoker. WHY IS SMOKING ADDICTIVE? Nicotine is the chemical agent in tobacco that is capable of causing addiction or dependence.  When you smoke and inhale, nicotine is absorbed rapidly into the bloodstream through your lungs. Nicotine absorbed through the lungs is capable of creating a powerful addiction. Both inhaled and non-inhaled nicotine may be addictive.  Addiction studies of cigarettes and spit tobacco show that addiction to nicotine occurs mainly during the teen years, when young people begin using tobacco products.  WHAT ARE THE BENEFITS OF QUITTING?  There are many health benefits to quitting smoking.  Likelihood of developing cancer and heart disease decreases. Health improvements are seen almost immediately.  Blood pressure, pulse rate, and breathing patterns start returning to normal soon after quitting.  People who quit may  see an improvement in their overall quality of life.  Some people choose to quit all at once. Other options include nicotine replacement products, such as patches, gum, and nasal sprays. Do not use these  products without first checking with your caregiver. QUITTING SMOKING It is not easy to quit smoking. Nicotine is addicting, and longtime habits are hard to change. To start, you can write down all your reasons for quitting, tell your family and friends you want to quit, and ask for their help. Throw your cigarettes away, chew gum or cinnamon sticks, keep your hands busy, and drink extra water or juice. Go for walks and practice deep breathing to relax. Think of all the money you are saving: around $1,000 a year, for the average pack-a-day smoker. Nicotine patches and gum have been shown to improve success at efforts to stop smoking. Zyban (bupropion) is an anti-depressant drug that can be prescribed to reduce nicotine withdrawal symptoms and to suppress the urge to smoke. Smoking is an addiction with both physical and psychological effects. Joining a stop-smoking support group can help you cope with the emotional issues. For more information and advice on programs to stop smoking, call your doctor, your local hospital, or these organizations: American Lung Association - 1-800-LUNGUSA  American Cancer Society - 1-800-ACS-2345  Document Released: 01/23/2005 Document Revised: 08/28/2011 Document Reviewed: 09/27/2009 [facility] Patient Information 2012 [place], [place].  Smoking Cessation This document explains the best ways for you to quit smoking and new treatments to help. It lists new medicines that can double or triple your chances of quitting and quitting for good. It also considers ways to avoid relapses and concerns you may have about quitting, including weight gain. NICOTINE: A POWERFUL ADDICTION If you have tried to quit smoking, you know how hard it can be. It is hard because nicotine is a very addictive drug. For some people, it can be as addictive as heroin or cocaine. Usually, people make 2 or 3 tries, or more, before finally being able to quit. Each time you try to quit, you can learn about what  helps and what hurts. Quitting takes hard work and a lot of effort, but you can quit smoking. QUITTING SMOKING IS ONE OF THE MOST IMPORTANT THINGS YOU WILL EVER DO.  You will live longer, feel better, and live better.   The impact on your body of quitting smoking is felt almost immediately:   Within 20 minutes, blood pressure decreases. Pulse returns to its normal level.   After 8 hours, carbon monoxide levels in the blood return to normal. Oxygen level increases.   After 24 hours, chance of heart attack starts to decrease. Breath, hair, and body stop smelling like smoke.   After 48 hours, damaged nerve endings begin to recover. Sense of taste and smell improve.   After 72 hours, the body is virtually free of nicotine. Bronchial tubes relax and breathing becomes easier.   After 2 to 12 weeks, lungs can hold more air. Exercise becomes easier and circulation improves.   Quitting will reduce your risk of having a heart attack, stroke, cancer, or lung disease:   After 1 year, the risk of coronary heart disease is cut in half.   After 5 years, the risk of stroke falls to the same as a nonsmoker.   After 10 years, the risk of lung cancer is cut in half and the risk of other cancers decreases significantly.   After 15 years, the risk of coronary heart disease drops,  usually to the level of a nonsmoker.   If you are pregnant, quitting smoking will improve your chances of having a healthy baby.   The people you live with, especially your children, will be healthier.   You will have extra money to spend on things other than cigarettes.  FIVE KEYS TO QUITTING Studies have shown that these 5 steps will help you quit smoking and quit for good. You have the best chances of quitting if you use them together: 1. Get ready.  2. Get support and encouragement.  3. Learn new skills and behaviors.  4. Get medicine to reduce your nicotine addiction and use it correctly.  5. Be prepared for relapse  or difficult situations. Be determined to continue trying to quit, even if you do not succeed at first.  1. GET READY  Set a quit date.   Change your environment.   Get rid of ALL cigarettes, ashtrays, matches, and lighters in your home, car, and place of work.   Do not let people smoke in your home.   Review your past attempts to quit. Think about what worked and what did not.   Once you quit, do not smoke. NOT EVEN A PUFF!  2. GET SUPPORT AND ENCOURAGEMENT Studies have shown that you have a better chance of being successful if you have help. You can get support in many ways.  Tell your family, friends, and coworkers that you are going to quit and need their support. Ask them not to smoke around you.   Talk to your caregivers (doctor, dentist, nurse, pharmacist, psychologist, and/or smoking counselor).   Get individual, group, or telephone counseling and support. The more counseling you have, the better your chances are of quitting. Programs are available at [employer] and health centers. Call your local health department for information about programs in your area.   Spiritual beliefs and practices may help some smokers quit.   Quit meters are Photographer that keep track of quit statistics, such as amount of "quit-time," cigarettes not smoked, and money saved.   Many smokers find one or more of the many self-help books available useful in helping them quit and stay off tobacco.  3. LEARN NEW SKILLS AND BEHAVIORS  Try to distract yourself from urges to smoke. Talk to someone, go for a walk, or occupy your time with a task.   When you first try to quit, change your routine. Take a different route to work. Drink tea instead of coffee. Eat breakfast in a different place.   Do something to reduce your stress. Take a hot bath, exercise, or read a book.   Plan something enjoyable to do every day. Reward yourself for not smoking.   Explore  interactive web-based programs that specialize in helping you quit.  4. GET MEDICINE AND USE IT CORRECTLY Medicines can help you stop smoking and decrease the urge to smoke. Combining medicine with the above behavioral methods and support can quadruple your chances of successfully quitting smoking. The [place]. Food and Drug Administration (FDA) has approved 7 medicines to help you quit smoking. These medicines fall into 3 categories.  Nicotine replacement therapy (delivers nicotine to your body without the negative effects and risks of smoking):   Nicotine gum: Available over-the-counter.   Nicotine lozenges: Available over-the-counter.   Nicotine inhaler: Available by prescription.   Nicotine nasal spray: Available by prescription.   Nicotine skin patches (transdermal): Available by prescription and over-the-counter.   Antidepressant  medicine (helps people abstain from smoking, but how this works is unknown):   Bupropion sustained-release (SR) tablets: Available by prescription.   Nicotinic receptor partial agonist (simulates the effect of nicotine in your brain):   Varenicline tartrate tablets: Available by prescription.   Ask your caregiver for advice about which medicines to use and how to use them. Carefully read the information on the package.   Everyone who is trying to quit may benefit from using a medicine. If you are pregnant or trying to become pregnant, nursing an infant, you are under age 58, or you smoke fewer than 10 cigarettes per day, talk to your caregiver before taking any nicotine replacement medicines.   You should stop using a nicotine replacement product and call your caregiver if you experience nausea, dizziness, weakness, vomiting, fast or irregular heartbeat, mouth problems with the lozenge or gum, or redness or swelling of the skin around the patch that does not go away.   Do not use any other product containing nicotine while using a nicotine replacement  product.   Talk to your caregiver before using these products if you have diabetes, heart disease, asthma, stomach ulcers, you had a recent heart attack, you have high blood pressure that is not controlled with medicine, a history of irregular heartbeat, or you have been prescribed medicine to help you quit smoking.  5. BE PREPARED FOR RELAPSE OR DIFFICULT SITUATIONS  Most relapses occur within the first 3 months after quitting. Do not be discouraged if you start smoking again. Remember, most people try several times before they finally quit.   You may have symptoms of withdrawal because your body is used to nicotine. You may crave cigarettes, be irritable, feel very hungry, cough often, get headaches, or have difficulty concentrating.   The withdrawal symptoms are only temporary. They are strongest when you first quit, but they will go away within 10 to 14 days.  Here are some difficult situations to watch for:  Alcohol. Avoid drinking alcohol. Drinking lowers your chances of successfully quitting.   Caffeine. Try to reduce the amount of caffeine you consume. It also lowers your chances of successfully quitting.   Other smokers. Being around smoking can make you want to smoke. Avoid smokers.   Weight gain. Many smokers will gain weight when they quit, usually less than 10 pounds. Eat a healthy diet and stay active. Do not let weight gain distract you from your main goal, quitting smoking. Some medicines that help you quit smoking may also help delay weight gain. You can always lose the weight gained after you quit.   Bad mood or depression. There are a lot of ways to improve your mood other than smoking.  If you are having problems with any of these situations, talk to your caregiver. SPECIAL SITUATIONS AND CONDITIONS Studies suggest that everyone can quit smoking. Your situation or condition can give you a special reason to quit.  Pregnant women/new mothers: By quitting, you protect your  baby's health and your own.   Hospitalized patients: By quitting, you reduce health problems and help healing.   Heart attack patients: By quitting, you reduce your risk of a second heart attack.   Lung, head, and neck cancer patients: By quitting, you reduce your chance of a second cancer.   Parents of children and adolescents: By quitting, you protect your children from illnesses caused by secondhand smoke.  QUESTIONS TO THINK ABOUT Think about the following questions before you try to stop smoking. You  may want to talk about your answers with your caregiver.  Why do you want to quit?   If you tried to quit in the past, what helped and what did not?   What will be the most difficult situations for you after you quit? How will you plan to handle them?   Who can help you through the tough times? Your family? Friends? Caregiver?   What pleasures do you get from smoking? What ways can you still get pleasure if you quit?  Here are some questions to ask your caregiver:  How can you help me to be successful at quitting?   What medicine do you think would be best for me and how should I take it?   What should I do if I need more help?   What is smoking withdrawal like? How can I get information on withdrawal?  Quitting takes hard work and a lot of effort, but you can quit smoking. FOR MORE INFORMATION  Smokefree.gov (http://www.[employer].com/) provides free, accurate, evidence-based information and professional assistance to help support the immediate and long-term needs of people trying to quit smoking. Document Released: 12/10/2001 Document Revised: 08/28/2011 Document Reviewed: 10/02/2009 [facility] Patient Information 2012 [place], [place].

## 2012-02-13 NOTE — Progress Notes (Signed)
Latoya [name] 21-Jan-1976 [id number]        36 y.o.  for annual exam.  Several issues noted below.  Past medical history,surgical history, medications, allergies, family history and social history were all reviewed and documented in the EPIC chart. ROS:  Was performed and pertinent positives and negatives are included in the history.  Exam: Sherrilyn Rist chaperone present Filed Vitals:   02/13/12 1004  BP: 128/78   General appearance  Normal Skin grossly normal Head/Neck normal with no cervical or supraclavicular adenopathy thyroid normal Lungs  clear Cardiac RR, without RMG Abdominal  soft, nontender, without masses, organomegaly or hernia Breasts  examined lying and sitting without masses, retractions, discharge or axillary adenopathy. Pelvic  Ext/BUS/vagina  normal   Adnexa  Without masses or tenderness    Anus and perineum  normal   Rectovaginal  normal sphincter tone without palpated masses or tenderness.    Assessment/Plan:  36 y.o. female for annual exam.    1. Right lower quadrant pain. Patient notes the past several months fleeting right lower quadrant pain occurs once a week lasts for several minutes. No nausea vomiting diarrhea constipation or urinary symptoms. Feels almost like when she had endometriosis before.  Exam is normal. Discussed for his possibilities to include colonic spasm, adhesive disease possible endometriosis status post her hysterectomy on estrogen. Regardless it is not that significant to her and given the normal exam we'll plan on observation right now. If it would persist or worsen than I will refer to GI for their evaluation she is comfortable with this plan. 2. Slightly elevated blood pressure. Her blood pressure is 120/78. I retook it after the exam it was 120/78. Patient is going to have it rechecked in a non-exam situation. 3. ERT. I again reviewed ERT with her, risks benefits, [facility] study increased risk of stroke heart attack DVT possible breast cancer  issues. Patient is doing well wants to continue I refilled her Estrace 1 mg x1 year. 4. Mammography. Patient has not had her baseline mammography. I discussed screening guidelines between 35 and 40 and she'll decide when she wants to do this. SBE monthly reviewed. 5. Pap smear. No Pap smear was done today.  She has no history of abnormal Pap smears with a number of normal reports in her chart. Her last Pap smear was December 2011. I discussed current screening guidelines. She is status post hysterectomy the options to stop altogether or be less frequent intervals reviewed and we'll readdress this on an annual basis. 6. Smoking. I again reviewed the risks of smoking particularly with ERT a possible increased thrombosis risk. Recommendations for discontinuation were reviewed. 7. Health maintenance. Baseline CBC lipid profile glucose and urinalysis were ordered. Assuming she continues well for gynecologic standpoint and she'll see me in a year sooner as needed.    Dara Lords MD, 10:28 AM 02/13/2012

## 2012-02-14 LAB — CBC WITH DIFFERENTIAL/PLATELET
Basophils Relative: 0 % (ref 0–1)
Eosinophils Relative: 2 % (ref 0–5)
HCT: 42 % (ref 36.0–46.0)
MCH: 32.2 pg (ref 26.0–34.0)
MCV: 100.2 fL — ABNORMAL HIGH (ref 78.0–100.0)
Monocytes Relative: 8 % (ref 3–12)
RBC: 4.19 MIL/uL (ref 3.87–5.11)

## 2012-02-14 LAB — LIPID PANEL
Cholesterol: 163 mg/dL (ref 0–200)
LDL Cholesterol: 54 mg/dL (ref 0–99)
Triglycerides: 130 mg/dL (ref <150)

## 2012-02-14 LAB — GLUCOSE, RANDOM: Glucose, Bld: 82 mg/dL (ref 70–99)

## 2012-06-03 ENCOUNTER — Other Ambulatory Visit: Payer: Self-pay | Admitting: Physical Medicine and Rehabilitation

## 2012-06-03 DIAGNOSIS — M545 Low back pain: Secondary | ICD-10-CM

## 2012-06-12 ENCOUNTER — Other Ambulatory Visit: Payer: Managed Care, Other (non HMO)

## 2012-06-12 ENCOUNTER — Ambulatory Visit
Admission: RE | Admit: 2012-06-12 | Discharge: 2012-06-12 | Disposition: A | Discharge status: Home / Self Care | Payer: Managed Care, Other (non HMO) | Source: Ambulatory Visit | Attending: Physical Medicine and Rehabilitation | Admitting: Physical Medicine and Rehabilitation

## 2012-06-12 DIAGNOSIS — M545 Low back pain: Secondary | ICD-10-CM

## 2012-07-01 ENCOUNTER — Other Ambulatory Visit: Payer: Self-pay

## 2012-07-03 ENCOUNTER — Encounter (HOSPITAL_COMMUNITY): Payer: Self-pay

## 2012-07-07 NOTE — H&P (Signed)
Latoya [name] 07/07/2012 8:16 AM [facility]: [facility] Patient #: [id number] DOB: [birth year] Divorced / Language: [place] / Race: White Female   History of Present Illness(Shanae Luo J Marcelline Mates, PA-C; 07/07/2012 5:20 PM) The patient is a 36 year old female who comes in today for a preoperative History and Physical. The patient is scheduled for a TDR vs ALIF L4-5 (discogenic low back pain with stenosis) to be performed by Dr. Debria Garret D. Shon Baton, MD at [facility] on Wednesday, July 15, 2012 at 0830 . Please see the hospital record for complete dictated history and physical.    Allergies(Isahi Godwin J Faraz Ponciano, PA-C; 07/07/2012 9:57 AM) CODEINE. 12/19/2009 causes severe itching   Family History(Sharon Gillian Shields; 07/07/2012 9:14 AM) Cancer. mother, father, grandmother mothers side and grandfather mothers side Cerebrovascular Accident. mother and grandfather mothers side Diabetes Mellitus. mother Drug / Alcohol Addiction. father, grandfather mothers side and grandmother fathers side Heart Disease. mother and grandfather mothers side Hypertension. mother and father Liver Disease, Chronic. mother, father and grandmother fathers side   Social History(Sharon Gillian Shields; 07/07/2012 9:14 AM) Children. 1 Current work status. working full time Drug/Alcohol Rehab (Currently). no Exercise. Exercises weekly; does running / walking and other Illicit drug use. no Living situation. live with partner Marital status. divorced Most recent primary occupation. Recovery Analyst Number of flights of stairs before winded. 4-5 Pain Contract. no Previously in rehab. no Tobacco / smoke exposure. yes outdoors only Alcohol use. current drinker; drinks wine; less than 5 per week Tobacco use. Smokes 1 pack of cigarettes per day. Tobacco use. current every day smoker; smoke(d) 3 or more pack(s) per day; uses less than half 1/2 can(s) smokeless per week (Marked as  Inactive)   Medication History(Trevell Pariseau J Jacqulyne Gladue, PA-C; 07/07/2012 9:59 AM) ZyrTEC Allergy (10MG  Capsule, Oral) Active. (PRN) Robaxin (500MG  Tablet, Oral) Active. (once or twice weekly) Estradiol (1MG  Tablet, Oral daily) Active. PriLOSEC OTC (20MG  Tablet DR, Oral daily) Active. Aleve (220MG  Capsule, 1 Oral prn) Active. (PRN)   Past Surgical History(Sharon Gillian Shields; 07/07/2012 9:18 AM) Tubal Ligation Gastric Bypass Carpal Tunnel Repair. right Cesarean Delivery. 1 time Hysterectomy. complete (non-cancerous) Oophorectomy; Bilateral   Other Problems(Sharon Gillian Shields; 07/07/2012 9:18 AM) Anemia Chronic Ulcerative Colitis Migraine Headache   Review of Systems(Nikolas Casher J Miosha Behe, PA-C; 07/07/2012 10:20 AM) General:Not Present- Chills, Fever, Night Sweats, Appetite Loss, Fatigue, Feeling sick, Weight Gain and Weight Loss. Skin:Not Present- Itching, Rash, Skin Color Changes, Ulcer, Psoriasis and Change in Hair or Nails. HEENT:Not Present- Sensitivity to light, Hearing problems, Nose Bleed and Ringing in the Ears. Neck:Not Present- Swollen Glands and Neck Mass. Respiratory:Not Present- Snoring, Chronic Cough, Bloody sputum and Dyspnea. Cardiovascular:Not Present- Shortness of Breath, Chest Pain, Swelling of Extremities, Leg Cramps and Palpitations. Gastrointestinal:Not Present- Bloody Stool, Heartburn, Abdominal Pain, Vomiting, Nausea and Incontinence of Stool. Female Genitourinary:Not Present- Blood in Urine, Menstrual Irregularities, Frequency, Incontinence and Nocturia. Musculoskeletal:Present- Back Pain. Not Present- Muscle Weakness, Muscle Pain, Joint Stiffness, Joint Swelling and Joint Pain. Neurological:Not Present- Tingling, Numbness, Burning, Tremor, Headaches and Dizziness. Psychiatric:Not Present- Anxiety, Depression and Memory Loss. Endocrine:Not Present- Cold Intolerance, Heat Intolerance, Excessive hunger and Excessive Thirst. Hematology:Not Present- Abnormal  Bleeding, Anemia, Blood Clots and Easy Bruising.   Vitals(Sharon Gillian Shields; 07/07/2012 9:20 AM) 07/07/2012 9:19 AM Weight: 165 lb Height: 67 in Body Surface Area: 1.88 m Body Mass Index: 25.84 kg/m Pulse: 76 (Regular) BP: 134/80 (Sitting, Left Arm, Standard)    Physical Exam(Nicky Kras J Kaleb Sek, PA-C; 07/07/2012 9:57 AM) The physical exam findings are as  follows:   General General Appearance- pleasant. Not in acute distress. Orientation- Oriented X3. Build & Nutrition- Well nourished and Well developed. Posture- Normal posture. Gait- Normal. Mental Status- Alert.   Integumentary Lumbar Spine- Skin examination of the lumbar spine is without deformity, skin lesions, lacerations or abrasions.   Head and Neck Neck Global Assessment- supple. no lymphadenopathy and no nucchal rigidty.   Eye Pupil- Bilateral- Normal, Direct reaction to light normal, Equal and Regular. Motion- Bilateral- EOMI.   Chest and Lung Exam Auscultation: Breath sounds:- Clear.   Cardiovascular Auscultation:Rhythm- Regular rate and rhythm. Heart Sounds- Normal heart sounds.   Abdomen Palpation/Percussion:Palpation and Percussion of the abdomen reveal - Non Tender, No Rebound tenderness and Soft.   Peripheral Vascular Lower Extremity:Inspection- Bilateral- Inspection Normal. Palpation:Posterior tibial pulse- Bilateral- 2+. Dorsalis pedis pulse- Bilateral- 2+.   Neurologic Sensation:Lower Extremity- Bilateral- sensation is intact in the lower extremity. Reflexes:Patellar Reflex- Bilateral- 1+. Achilles Reflex- Bilateral- 1+. Babinski- Bilateral- Babinski not present. Clonus- Bilateral- clonus not present. Testing:Seated Straight Leg Raise- Bilateral- Seated straight leg raise negative (reproduces low back, buttock and upper posterior thigh pain).   Musculoskeletal Spine/Ribs/Pelvis Lumbosacral Spine:Inspection and Palpation-  Tenderness- generalized. bony and soft tissue palpation of the lumbar spine and SI joint does not recreate their typical pain. Strength and Tone: Strength:Hip Flexion- Bilateral- 5/5. Knee Extension- Bilateral- 5/5. Knee Flexion- Bilateral- 5/5. Ankle Dorsiflexion- Bilateral- 5/5. Ankle Plantarflexion- Bilateral- 5/5. Heel walk- Bilateral- able to heel walk without difficulty. Toe Walk- Bilateral- able to walk on toes without difficulty. Heel-Toe Walk- Bilateral- able to heel-toe walk without difficulty. ROM- Flexion- severely decreased range of motion and painful. Extension- severely decreased range of motion and painful. Pain:- flexion is more painful than extension. Waddell's Signs- no Waddell's signs present. Lower Extremity Range of Motion:- No true hip, knee or ankle pain with range of motion. Gait and Station:Assistive Devices- no assistive devices.   Assessment & Plan(Joni Colegrove J Bear Osten, PA-C; 07/07/2012 9:53 AM) Lumbar/Lumbosacral Disc Degeneration (722.52)  Lumbar pain (724.2)  Note: Unfortunately, conservative measures consisting of observation, activity modification, physical therapy, oral pain medications and injection therapy have failed to alleviate her symptoms and given the ongoing nature of her pain and the significant decrease in her quality of life, she wishes to proceed with surgery. Risks/benefits/alternatives/expectations following the procedure have been reviewed with the patient by Dr. Shon Baton. She understands.   CT scan of the lumbar spine dated 06/12/12 demonstrates annular tearing from 5 o'clock to 7 o'clock with extrusion in the midline with stenosis of the canal and lateral recesses. Please see the scanned report for the remaining specifics.  She has been fitted for a lumbar corsett brace and knows to bring this with her the morning of surgery. She has not yet completed her pre-op hospital requirements and is scheduled to do so on Friday July,  12.   All of her questions have been encouraged, addressed and answered. Plan, at this time, is to proceed with surgery as scheduled.  Procedure was reviewed with the patient today.   Signed electronically by Gwinda Maine, PA-C (07/07/2012 5:21 PM)   Jari Sportsman 06/19/2012 9:00 AM [facility] Patient #: [id number] DOB: [birth year] Divorced / Language: [place] / Race: White Female   History of Present Illness(Sharon Gillian Shields; 06/19/2012 9:03 AM) The patient is a 36 year old female who presents today for follow up of their back. The patient is being followed for their central back pain. They are now 6 month(s) out from when  symptoms began. The patient states that they are doing poorly. The following medication has been used for pain control: robaxin. The patient presents today following CT scan (discogram).    Subjective Transcription(DAHARI Sheela Stack, MD; 06/23/2012 2:41 PM)  Latoya [name] returns today for followup.    Allergies(Sharon Gillian Shields; 06/19/2012 9:03 AM) CODEINE. 12/19/2009   Social History(Sharon J Roxan Hockey; 06/19/2012 9:03 AM) Tobacco use. Smokes 1 pack of cigarettes per day.   Medication History(Sharon Gillian Shields; 06/19/2012 9:03 AM) Estradiol (1MG  Tablet, Oral) Active. PriLOSEC OTC (20MG  Tablet DR, Oral) Active. Aleve (220MG  Capsule, 1 Oral) Active.   Objective Transcription(DAHARI Sheela Stack, MD; 06/23/2012 2:41 PM)  IMAGES:  I have had an opportunity to review the lumbar disk CT as well as the diskogram report.  I do believer that the L4-5 level is the only positive concordant level. The L3-4 was negative. L5-S1 was an annular injection and caused some pain but not her typical pain. And, of course, an annular injection will cause pain. At this point in time, the discogram, MRI, x-rays and CT scan all point to L4-5. There is an annular leak into the epidural space, protruding disk material causing compression of  the thecal sac seen on the CT scan. There was also narrowing of the lateral recesses as well.    Assessment & Plan(DAHARI D BROOKS, MD; 06/19/2012 9:27 AM) Lumbar/Lumbosacral Disc Degeneration (722.52)   Assessments Transcription(DAHARI D BROOKS, MD; 06/23/2012 2:41 PM)  At this point in time, we had a long discussion about lumbar total disk replacement. The patient is interested in proceeding with this. I do believe we have confirmed that the L4-5 level is here soul pain generator. The risks of surgery, as I have explained them to the patient, include infection, bleeding, nerve damage, death, stroke, paralysis, failure to heal, need for further surgery, ongoing or worse pain, loss of bowel or bladder control, blood clots, adjacent segment disease.    Plans Transcription(DAHARI Sheela Stack, MD; 06/23/2012 2:41 PM)  All of the patient's questions have been addressed. I have given her some literature to review. We will plan on proceeding with the diagnosis hopefully next month.    Miscellaneous Transcription(DAHARI Sheela Stack, MD; 06/23/2012 2:41 PM)  Debria Garret D. Shon Baton, MD/kro  T: 06/23/2012  D: 06/19/2012    Signed electronically by Alvy Beal, MD (06/19/2012 3:43 PM)

## 2012-07-10 ENCOUNTER — Encounter (HOSPITAL_COMMUNITY)
Admission: RE | Admit: 2012-07-10 | Discharge: 2012-07-10 | Disposition: A | Discharge status: Home / Self Care | Payer: 59 | Source: Ambulatory Visit | Attending: Physician Assistant | Admitting: Physician Assistant

## 2012-07-10 ENCOUNTER — Encounter (HOSPITAL_COMMUNITY): Payer: Self-pay

## 2012-07-10 ENCOUNTER — Encounter (HOSPITAL_COMMUNITY)
Admission: RE | Admit: 2012-07-10 | Discharge: 2012-07-10 | Disposition: A | Discharge status: Home / Self Care | Payer: 59 | Source: Ambulatory Visit | Attending: Orthopedic Surgery | Admitting: Orthopedic Surgery

## 2012-07-10 HISTORY — DX: Other specified postprocedural states: Z98.890

## 2012-07-10 HISTORY — DX: Other supraventricular tachycardia: I47.19

## 2012-07-10 HISTORY — DX: Urinary tract infection, site not specified: N39.0

## 2012-07-10 HISTORY — DX: Nausea with vomiting, unspecified: R11.2

## 2012-07-10 HISTORY — DX: Anemia, unspecified: D64.9

## 2012-07-10 HISTORY — DX: Supraventricular tachycardia: I47.1

## 2012-07-10 HISTORY — DX: Gastro-esophageal reflux disease without esophagitis: K21.9

## 2012-07-10 LAB — COMPREHENSIVE METABOLIC PANEL
Alkaline Phosphatase: 56 U/L (ref 39–117)
BUN: 7 mg/dL (ref 6–23)
Calcium: 9.2 mg/dL (ref 8.4–10.5)
GFR calc Af Amer: >90 mL/min (ref >90)
Glucose, Bld: 97 mg/dL (ref 70–99)
Potassium: 3.5 mEq/L (ref 3.5–5.1)
Total Protein: 6.8 g/dL (ref 6.0–8.3)

## 2012-07-10 LAB — CBC
HCT: 40.7 % (ref 36.0–46.0)
Hemoglobin: 13.8 g/dL (ref 12.0–15.0)
MCH: 32.9 pg (ref 26.0–34.0)
MCHC: 33.9 g/dL (ref 30.0–36.0)

## 2012-07-10 LAB — DIFFERENTIAL
Basophils Absolute: 0 10*3/uL (ref 0.0–0.1)
Eosinophils Absolute: 0.1 10*3/uL (ref 0.0–0.7)
Lymphs Abs: 1.5 10*3/uL (ref 0.7–4.0)
Neutrophils Relative %: 70 % (ref 43–77)

## 2012-07-10 LAB — PROTIME-INR
INR: 0.91 (ref 0.00–1.49)
Prothrombin Time: 12.5 seconds (ref 11.6–15.2)

## 2012-07-10 LAB — ABO/RH: ABO/RH(D): AB NEG

## 2012-07-10 NOTE — Pre-Procedure Instructions (Addendum)
20 Latoya [name]  07/10/2012   Your procedure is scheduled on:  07/15/12  Report to [facility] at 630 AM.  Call this number if you have problems the morning of surgery: [phone]   Remember:   Do not eat food:or drinkAfter Midnight.  .  Take these medicines the morning of surgery with A SIP OF WATER zyrtec, prilosec , robaxin STOP any aspirin,ibuprofen, herbal meds, blood thinners   Do not wear jewelry, make-up or nail polish.  Do not wear lotions, powders, or perfumes. You may wear deodorant.  Do not shave 48 hours prior to surgery. Men may shave face and neck.  Do not bring valuables to the hospital.  Contacts, dentures or bridgework may not be worn into surgery.  Leave suitcase in the car. After surgery it may be brought to your room.  For patients admitted to the hospital, checkout time is 11:00 AM the day of discharge.   Patients discharged the day of surgery will not be allowed to drive home.  Name and phone number of your driver: Kate Sable [phone]  Special Instructions: Incentive Spirometry - Practice and bring it with you on the day of surgery. and CHG Shower Use Special Wash: 1/2 bottle night before surgery and 1/2 bottle morning of surgery.   Please read over the following fact sheets that you were given: Pain Booklet, Coughing and Deep Breathing, Blood Transfusion Information, MRSA Information and Surgical Site Infection Prevention

## 2012-07-14 MED ORDER — CEFAZOLIN SODIUM-DEXTROSE 2-3 GM-% IV SOLR
2.0000 g | INTRAVENOUS | Status: DC
  Filled 2012-07-14: qty 50

## 2012-07-15 ENCOUNTER — Ambulatory Visit (HOSPITAL_COMMUNITY): Payer: 59

## 2012-07-15 ENCOUNTER — Ambulatory Visit (HOSPITAL_COMMUNITY): Payer: 59 | Admitting: Certified Registered"

## 2012-07-15 ENCOUNTER — Inpatient Hospital Stay (HOSPITAL_COMMUNITY)
Admission: RE | Admit: 2012-07-15 | Discharge: 2012-07-17 | DRG: 460 | Disposition: A | Discharge status: Home Health | Payer: 59 | Source: Ambulatory Visit | Attending: Orthopedic Surgery | Admitting: Orthopedic Surgery

## 2012-07-15 ENCOUNTER — Encounter (HOSPITAL_COMMUNITY)
Admission: RE | Disposition: A | Discharge status: Home Health | Payer: Self-pay | Source: Ambulatory Visit | Attending: Orthopedic Surgery

## 2012-07-15 ENCOUNTER — Encounter (HOSPITAL_COMMUNITY): Payer: Self-pay | Admitting: Surgery

## 2012-07-15 ENCOUNTER — Encounter (HOSPITAL_COMMUNITY): Payer: Self-pay | Admitting: Certified Registered"

## 2012-07-15 DIAGNOSIS — M51379 Other intervertebral disc degeneration, lumbosacral region without mention of lumbar back pain or lower extremity pain: Principal | ICD-10-CM | POA: Diagnosis present

## 2012-07-15 DIAGNOSIS — M5137 Other intervertebral disc degeneration, lumbosacral region: Principal | ICD-10-CM | POA: Diagnosis present

## 2012-07-15 DIAGNOSIS — Z9884 Bariatric surgery status: Secondary | ICD-10-CM

## 2012-07-15 DIAGNOSIS — Z885 Allergy status to narcotic agent status: Secondary | ICD-10-CM

## 2012-07-15 DIAGNOSIS — F17200 Nicotine dependence, unspecified, uncomplicated: Secondary | ICD-10-CM | POA: Diagnosis present

## 2012-07-15 DIAGNOSIS — Z9079 Acquired absence of other genital organ(s): Secondary | ICD-10-CM

## 2012-07-15 DIAGNOSIS — Z90710 Acquired absence of both cervix and uterus: Secondary | ICD-10-CM

## 2012-07-15 DIAGNOSIS — M549 Dorsalgia, unspecified: Secondary | ICD-10-CM

## 2012-07-15 HISTORY — DX: Personal history of other medical treatment: Z92.89

## 2012-07-15 HISTORY — PX: ANTERIOR LUMBAR FUSION: SHX1170

## 2012-07-15 HISTORY — DX: Chronic or unspecified gastric ulcer with hemorrhage: K25.4

## 2012-07-15 HISTORY — DX: Migraine, unspecified, not intractable, without status migrainosus: G43.909

## 2012-07-15 SURGERY — ANTERIOR LUMBAR FUSION 1 LEVEL
Anesthesia: General | Site: Abdomen | Wound class: Clean

## 2012-07-15 MED ORDER — SODIUM CHLORIDE 0.9 % IJ SOLN: 3.0000 mL | INTRAMUSCULAR | Status: DC | PRN

## 2012-07-15 MED ORDER — ACETAMINOPHEN 10 MG/ML IV SOLN
1000.0000 mg | Freq: Once | INTRAVENOUS | Status: DC
  Filled 2012-07-15: qty 100

## 2012-07-15 MED ORDER — ZOLPIDEM TARTRATE 5 MG PO TABS: 5.0000 mg | ORAL_TABLET | Freq: Every evening | ORAL | Status: DC | PRN

## 2012-07-15 MED ORDER — DEXAMETHASONE SODIUM PHOSPHATE 4 MG/ML IJ SOLN
4.0000 mg | Freq: Four times a day (QID) | INTRAMUSCULAR | Status: DC
Start: 2012-07-15 — End: 2012-07-17
  Filled 2012-07-15 (×11): qty 1

## 2012-07-15 MED ORDER — THROMBIN 20000 UNITS EX KIT
PACK | OROMUCOSAL | Status: DC | PRN
  Administered 2012-07-15: 09:00:00 via TOPICAL

## 2012-07-15 MED ORDER — OXYCODONE HCL 5 MG PO TABS
10.0000 mg | ORAL_TABLET | ORAL | Status: DC | PRN
  Administered 2012-07-15 – 2012-07-17 (×6): 10 mg via ORAL
  Filled 2012-07-15 (×6): qty 2

## 2012-07-15 MED ORDER — DEXAMETHASONE SODIUM PHOSPHATE 10 MG/ML IJ SOLN
10.0000 mg | Freq: Once | INTRAMUSCULAR | Status: DC
  Filled 2012-07-15: qty 1

## 2012-07-15 MED ORDER — ACETAMINOPHEN 10 MG/ML IV SOLN
INTRAVENOUS | Status: AC
  Filled 2012-07-15: qty 100

## 2012-07-15 MED ORDER — DEXAMETHASONE SODIUM PHOSPHATE 10 MG/ML IJ SOLN
INTRAMUSCULAR | Status: DC | PRN
  Administered 2012-07-15: 10 mg via INTRAVENOUS

## 2012-07-15 MED ORDER — FLEET ENEMA 7-19 GM/118ML RE ENEM: 1.0000 | ENEMA | Freq: Once | RECTAL | Status: AC | PRN

## 2012-07-15 MED ORDER — METHOCARBAMOL 500 MG PO TABS
500.0000 mg | ORAL_TABLET | Freq: Four times a day (QID) | ORAL | Status: DC | PRN
  Administered 2012-07-15 – 2012-07-17 (×5): 500 mg via ORAL
  Filled 2012-07-15 (×5): qty 1

## 2012-07-15 MED ORDER — SCOPOLAMINE 1 MG/3DAYS TD PT72
1.0000 | MEDICATED_PATCH | Freq: Once | TRANSDERMAL | Status: DC
  Filled 2012-07-15: qty 1

## 2012-07-15 MED ORDER — GLYCOPYRROLATE 0.2 MG/ML IJ SOLN
INTRAMUSCULAR | Status: DC | PRN
  Administered 2012-07-15: .8 mg via INTRAVENOUS

## 2012-07-15 MED ORDER — HEMOSTATIC AGENTS (NO CHARGE) OPTIME
TOPICAL | Status: DC | PRN
  Administered 2012-07-15: 1 via TOPICAL

## 2012-07-15 MED ORDER — HYDROMORPHONE HCL PF 1 MG/ML IJ SOLN
0.2500 mg | INTRAMUSCULAR | Status: DC | PRN
  Administered 2012-07-15 (×2): 0.5 mg via INTRAVENOUS

## 2012-07-15 MED ORDER — ONDANSETRON HCL 4 MG/2ML IJ SOLN: 4.0000 mg | INTRAMUSCULAR | Status: DC | PRN

## 2012-07-15 MED ORDER — LACTATED RINGERS IV SOLN
INTRAVENOUS | Status: DC
  Administered 2012-07-15: 23:00:00 via INTRAVENOUS

## 2012-07-15 MED ORDER — FENTANYL CITRATE 0.05 MG/ML IJ SOLN
INTRAMUSCULAR | Status: DC | PRN
  Administered 2012-07-15 (×2): 50 ug via INTRAVENOUS
  Administered 2012-07-15: 100 ug via INTRAVENOUS
  Administered 2012-07-15 (×4): 50 ug via INTRAVENOUS
  Administered 2012-07-15: 100 ug via INTRAVENOUS

## 2012-07-15 MED ORDER — CEFAZOLIN SODIUM 1-5 GM-% IV SOLN
INTRAVENOUS | Status: DC | PRN
  Administered 2012-07-15: 2 g via INTRAVENOUS

## 2012-07-15 MED ORDER — DOCUSATE SODIUM 100 MG PO CAPS
100.0000 mg | ORAL_CAPSULE | Freq: Two times a day (BID) | ORAL | Status: DC
  Administered 2012-07-15 – 2012-07-17 (×4): 100 mg via ORAL
  Filled 2012-07-15 (×6): qty 1

## 2012-07-15 MED ORDER — ACETAMINOPHEN 10 MG/ML IV SOLN
1000.0000 mg | Freq: Four times a day (QID) | INTRAVENOUS | Status: AC
  Administered 2012-07-15 – 2012-07-16 (×3): 1000 mg via INTRAVENOUS
  Filled 2012-07-15 (×4): qty 100

## 2012-07-15 MED ORDER — ONDANSETRON HCL 4 MG/2ML IJ SOLN
INTRAMUSCULAR | Status: DC | PRN
  Administered 2012-07-15 (×2): 4 mg via INTRAVENOUS

## 2012-07-15 MED ORDER — FENTANYL 10 MCG/ML IV SOLN
INTRAVENOUS | Status: DC
  Administered 2012-07-15: 190 ug via INTRAVENOUS
  Administered 2012-07-15: 14:00:00 via INTRAVENOUS
  Administered 2012-07-15: 230 ug via INTRAVENOUS
  Administered 2012-07-16: 130 ug via INTRAVENOUS
  Administered 2012-07-16: 150 ug via INTRAVENOUS
  Administered 2012-07-16: 100 ug via INTRAVENOUS
  Filled 2012-07-15 (×2): qty 50

## 2012-07-15 MED ORDER — HYDROMORPHONE HCL PF 1 MG/ML IJ SOLN
INTRAMUSCULAR | Status: AC
  Filled 2012-07-15: qty 1

## 2012-07-15 MED ORDER — SODIUM CHLORIDE 0.9 % IJ SOLN: 9.0000 mL | INTRAMUSCULAR | Status: DC | PRN

## 2012-07-15 MED ORDER — SCOPOLAMINE 1 MG/3DAYS TD PT72
MEDICATED_PATCH | TRANSDERMAL | Status: DC | PRN
  Administered 2012-07-15: 1 via TRANSDERMAL

## 2012-07-15 MED ORDER — DIPHENHYDRAMINE HCL 12.5 MG/5ML PO ELIX: 12.5000 mg | ORAL_SOLUTION | Freq: Four times a day (QID) | ORAL | Status: DC | PRN

## 2012-07-15 MED ORDER — METOCLOPRAMIDE HCL 5 MG/ML IJ SOLN: 10.0000 mg | Freq: Once | INTRAMUSCULAR | Status: DC | PRN

## 2012-07-15 MED ORDER — DEXAMETHASONE 4 MG PO TABS
4.0000 mg | ORAL_TABLET | Freq: Four times a day (QID) | ORAL | Status: DC
  Administered 2012-07-15 – 2012-07-17 (×6): 4 mg via ORAL
  Filled 2012-07-15 (×11): qty 1

## 2012-07-15 MED ORDER — MEPERIDINE HCL 25 MG/ML IJ SOLN: 6.2500 mg | INTRAMUSCULAR | Status: DC | PRN

## 2012-07-15 MED ORDER — SODIUM CHLORIDE 0.9 % IV SOLN
250.0000 mL | INTRAVENOUS | Status: DC
Start: 2012-07-15 — End: 2012-07-17

## 2012-07-15 MED ORDER — ACETAMINOPHEN 10 MG/ML IV SOLN
INTRAVENOUS | Status: DC | PRN
  Administered 2012-07-15: 1000 mg via INTRAVENOUS

## 2012-07-15 MED ORDER — CEFAZOLIN SODIUM 1-5 GM-% IV SOLN
1.0000 g | Freq: Three times a day (TID) | INTRAVENOUS | Status: AC
  Administered 2012-07-15 – 2012-07-16 (×2): 1 g via INTRAVENOUS
  Filled 2012-07-15 (×2): qty 50

## 2012-07-15 MED ORDER — LACTATED RINGERS IV SOLN
INTRAVENOUS | Status: DC | PRN
  Administered 2012-07-15 (×3): via INTRAVENOUS

## 2012-07-15 MED ORDER — DIPHENHYDRAMINE HCL 50 MG/ML IJ SOLN: 12.5000 mg | Freq: Four times a day (QID) | INTRAMUSCULAR | Status: DC | PRN

## 2012-07-15 MED ORDER — PHENOL 1.4 % MT LIQD: 1.0000 | OROMUCOSAL | Status: DC | PRN

## 2012-07-15 MED ORDER — ENOXAPARIN SODIUM 40 MG/0.4ML ~~LOC~~ SOLN
40.0000 mg | SUBCUTANEOUS | Status: DC
  Administered 2012-07-16 – 2012-07-17 (×2): 40 mg via SUBCUTANEOUS
  Filled 2012-07-15 (×3): qty 0.4

## 2012-07-15 MED ORDER — METHOCARBAMOL 100 MG/ML IJ SOLN
500.0000 mg | Freq: Four times a day (QID) | INTRAVENOUS | Status: DC | PRN
  Administered 2012-07-15: 500 mg via INTRAVENOUS
  Filled 2012-07-15: qty 5

## 2012-07-15 MED ORDER — ONDANSETRON HCL 4 MG/2ML IJ SOLN: 4.0000 mg | Freq: Four times a day (QID) | INTRAMUSCULAR | Status: DC | PRN

## 2012-07-15 MED ORDER — MENTHOL 3 MG MT LOZG: 1.0000 | LOZENGE | OROMUCOSAL | Status: DC | PRN

## 2012-07-15 MED ORDER — NEOSTIGMINE METHYLSULFATE 1 MG/ML IJ SOLN
INTRAMUSCULAR | Status: DC | PRN
  Administered 2012-07-15: 4 mg via INTRAVENOUS

## 2012-07-15 MED ORDER — LIDOCAINE HCL (CARDIAC) 20 MG/ML IV SOLN
INTRAVENOUS | Status: DC | PRN
  Administered 2012-07-15: 80 mg via INTRAVENOUS

## 2012-07-15 MED ORDER — SODIUM CHLORIDE 0.9 % IJ SOLN
3.0000 mL | Freq: Two times a day (BID) | INTRAMUSCULAR | Status: DC
Start: 2012-07-15 — End: 2012-07-17
  Administered 2012-07-15: 3 mL via INTRAVENOUS

## 2012-07-15 MED ORDER — LACTATED RINGERS IV SOLN: INTRAVENOUS | Status: DC

## 2012-07-15 MED ORDER — THROMBIN 20000 UNITS EX SOLR
CUTANEOUS | Status: AC
  Filled 2012-07-15: qty 20000

## 2012-07-15 MED ORDER — NALOXONE HCL 0.4 MG/ML IJ SOLN: 0.4000 mg | INTRAMUSCULAR | Status: DC | PRN

## 2012-07-15 MED ORDER — MIDAZOLAM HCL 5 MG/5ML IJ SOLN
INTRAMUSCULAR | Status: DC | PRN
  Administered 2012-07-15: 2 mg via INTRAVENOUS

## 2012-07-15 MED ORDER — PROPOFOL 10 MG/ML IV EMUL
INTRAVENOUS | Status: DC | PRN
  Administered 2012-07-15: 150 mg via INTRAVENOUS

## 2012-07-15 MED ORDER — ROCURONIUM BROMIDE 100 MG/10ML IV SOLN
INTRAVENOUS | Status: DC | PRN
  Administered 2012-07-15: 20 mg via INTRAVENOUS
  Administered 2012-07-15: 30 mg via INTRAVENOUS
  Administered 2012-07-15: 10 mg via INTRAVENOUS
  Administered 2012-07-15: 20 mg via INTRAVENOUS
  Administered 2012-07-15: 10 mg via INTRAVENOUS

## 2012-07-15 SURGICAL SUPPLY — 88 items
ADH SKN CLS APL DERMABOND .7 (GAUZE/BANDAGES/DRESSINGS) ×2
APPLIER CLIP 11 MED OPEN (CLIP) ×2
APR CLP MED 11 20 MLT OPN (CLIP) ×1
BLADE SURG 10 STRL SS (BLADE) ×2 IMPLANT
BLADE SURG ROTATE 9660 (MISCELLANEOUS) IMPLANT
CLIP APPLIE 11 MED OPEN (CLIP) ×1 IMPLANT
CLIP LIGATING EXTRA MED SLVR (CLIP) ×2 IMPLANT
CLIP LIGATING EXTRA SM BLUE (MISCELLANEOUS) ×2 IMPLANT
CLOTH BEACON ORANGE TIMEOUT ST (SAFETY) ×4 IMPLANT
CLSR STERI-STRIP ANTIMIC 1/2X4 (GAUZE/BANDAGES/DRESSINGS) ×1 IMPLANT
CORDS BIPOLAR (ELECTRODE) ×2 IMPLANT
COVER MAYO STAND STRL (DRAPES) ×1 IMPLANT
COVER SURGICAL LIGHT HANDLE (MISCELLANEOUS) ×4 IMPLANT
DERMABOND ADVANCED (GAUZE/BANDAGES/DRESSINGS) ×2
DERMABOND ADVANCED .7 DNX12 (GAUZE/BANDAGES/DRESSINGS) ×2 IMPLANT
DRAPE C-ARM 42X72 X-RAY (DRAPES) ×4 IMPLANT
DRAPE INCISE IOBAN 66X45 STRL (DRAPES) IMPLANT
DRAPE POUCH INSTRU U-SHP 10X18 (DRAPES) ×2 IMPLANT
DRAPE SURG 17X23 STRL (DRAPES) ×2 IMPLANT
DRAPE U-SHAPE 47X51 STRL (DRAPES) ×2 IMPLANT
DRSG MEPILEX BORDER 4X8 (GAUZE/BANDAGES/DRESSINGS) ×2 IMPLANT
DURAPREP 26ML APPLICATOR (WOUND CARE) ×2 IMPLANT
ELECT BLADE 4.0 EZ CLEAN MEGAD (MISCELLANEOUS) ×2
ELECT CAUTERY BLADE 6.4 (BLADE) ×2 IMPLANT
ELECT REM PT RETURN 9FT ADLT (ELECTROSURGICAL) ×2
ELECTRODE BLDE 4.0 EZ CLN MEGD (MISCELLANEOUS) ×1 IMPLANT
ELECTRODE REM PT RTRN 9FT ADLT (ELECTROSURGICAL) ×1 IMPLANT
GAUZE SPONGE 4X4 16PLY XRAY LF (GAUZE/BANDAGES/DRESSINGS) IMPLANT
GLOVE BIOGEL PI IND STRL 6.5 (GLOVE) ×1 IMPLANT
GLOVE BIOGEL PI IND STRL 8.5 (GLOVE) ×2 IMPLANT
GLOVE BIOGEL PI INDICATOR 6.5 (GLOVE) ×1
GLOVE BIOGEL PI INDICATOR 8.5 (GLOVE) ×2
GLOVE ECLIPSE 6.0 STRL STRAW (GLOVE) ×2 IMPLANT
GLOVE ECLIPSE 8.5 STRL (GLOVE) ×4 IMPLANT
GLOVE SS BIOGEL STRL SZ 7.5 (GLOVE) ×1 IMPLANT
GLOVE SUPERSENSE BIOGEL SZ 7.5 (GLOVE) ×2
GOWN PREVENTION PLUS XXLARGE (GOWN DISPOSABLE) ×4 IMPLANT
GOWN STRL NON-REIN LRG LVL3 (GOWN DISPOSABLE) ×7 IMPLANT
HEMOSTAT SNOW SURGICEL 2X4 (HEMOSTASIS) IMPLANT
INLAY POLYETHYLENE W/TANTALUM (Orthopedic Implant) ×1 IMPLANT
INSERT FOGARTY 61MM (MISCELLANEOUS) IMPLANT
INSERT FOGARTY SM (MISCELLANEOUS) IMPLANT
KIT BASIN OR (CUSTOM PROCEDURE TRAY) ×2 IMPLANT
KIT ROOM TURNOVER OR (KITS) ×4 IMPLANT
LOOP VESSEL MAXI BLUE (MISCELLANEOUS) IMPLANT
LOOP VESSEL MINI RED (MISCELLANEOUS) IMPLANT
NDL SPNL 18GX3.5 QUINCKE PK (NEEDLE) ×1 IMPLANT
NEEDLE SPNL 18GX3.5 QUINCKE PK (NEEDLE) ×2 IMPLANT
NS IRRIG 1000ML POUR BTL (IV SOLUTION) ×2 IMPLANT
PACK LAMINECTOMY ORTHO (CUSTOM PROCEDURE TRAY) ×2 IMPLANT
PACK UNIVERSAL I (CUSTOM PROCEDURE TRAY) ×2 IMPLANT
PAD ARMBOARD 7.5X6 YLW CONV (MISCELLANEOUS) ×8 IMPLANT
PLATE INFERIOR END (Plate) ×1 IMPLANT
PLATE MED SUPERIOR END (Plate) ×1 IMPLANT
SPONGE INTESTINAL PEANUT (DISPOSABLE) ×7 IMPLANT
SPONGE LAP 18X18 X RAY DECT (DISPOSABLE) IMPLANT
SPONGE LAP 4X18 X RAY DECT (DISPOSABLE) IMPLANT
SPONGE SURGIFOAM ABS GEL 100 (HEMOSTASIS) IMPLANT
STAPLER VISISTAT 35W (STAPLE) IMPLANT
STRIP CLOSURE SKIN 1/2X4 (GAUZE/BANDAGES/DRESSINGS) IMPLANT
SURGIFLO TRUKIT (HEMOSTASIS) ×1 IMPLANT
SUT MNCRL AB 3-0 PS2 18 (SUTURE) ×1 IMPLANT
SUT MNCRL AB 4-0 PS2 18 (SUTURE) ×1 IMPLANT
SUT PDS AB 1 CTX 36 (SUTURE) ×1 IMPLANT
SUT PROLENE 4 0 RB 1 (SUTURE)
SUT PROLENE 4-0 RB1 .5 CRCL 36 (SUTURE) ×4 IMPLANT
SUT PROLENE 5 0 C 1 24 (SUTURE) IMPLANT
SUT PROLENE 5 0 CC1 (SUTURE) IMPLANT
SUT PROLENE 6 0 C 1 30 (SUTURE) ×1 IMPLANT
SUT PROLENE 6 0 CC (SUTURE) IMPLANT
SUT SILK 0 TIES 10X30 (SUTURE) ×1 IMPLANT
SUT SILK 2 0 TIES 10X30 (SUTURE) ×2 IMPLANT
SUT SILK 2 0SH CR/8 30 (SUTURE) IMPLANT
SUT SILK 3 0 TIES 10X30 (SUTURE) ×2 IMPLANT
SUT SILK 3 0SH CR/8 30 (SUTURE) IMPLANT
SUT VIC AB 0 CT1 27 (SUTURE) ×2
SUT VIC AB 0 CT1 27XBRD ANBCTR (SUTURE) ×1 IMPLANT
SUT VIC AB 1 CT1 27 (SUTURE) ×6
SUT VIC AB 1 CT1 27XBRD ANBCTR (SUTURE) ×2 IMPLANT
SUT VIC AB 2-0 CT1 18 (SUTURE) ×2 IMPLANT
SUT VIC AB 2-0 CT1 36 (SUTURE) ×2 IMPLANT
SUT VIC AB 3-0 SH 27 (SUTURE) ×2
SUT VIC AB 3-0 SH 27X BRD (SUTURE) ×1 IMPLANT
SYR BULB IRRIGATION 50ML (SYRINGE) ×2 IMPLANT
TOWEL OR 17X24 6PK STRL BLUE (TOWEL DISPOSABLE) ×4 IMPLANT
TOWEL OR 17X26 10 PK STRL BLUE (TOWEL DISPOSABLE) ×4 IMPLANT
TRAY FOLEY CATH 14FRSI W/METER (CATHETERS) ×2 IMPLANT
WATER STERILE IRR 1000ML POUR (IV SOLUTION) ×2 IMPLANT

## 2012-07-15 NOTE — Progress Notes (Signed)
Noted that site was not added to consent. Lumbar four-five added to consent with patient and nurse initialing.//L. Satomi Buda,RN

## 2012-07-15 NOTE — Op Note (Signed)
OPERATIVE REPORT  DATE OF SURGERY: 07/15/2012  PATIENT: Latoya [name], 36 y.o. female MRN: [id number]  DOB: 03-Nov-1976  PRE-OPERATIVE DIAGNOSIS: L4-5 disc disease  POST-OPERATIVE DIAGNOSIS:  Same  PROCEDURE: Anterior exposure forALIF  SURGEON:  Gretta Began, M.D. Co-surgeon for the exposure: Dr. Shon Baton  ASSISTANT: Dr. Imogene Burn  ANESTHESIA:  Gen.  EBL: 300 ml  Total I/O In: 2500 [I.V.:2500] Out: 800 [Urine:500; Blood:300]  BLOOD ADMINISTERED: None  DRAINS: None    COUNTS CORRECT:  YES  PLAN OF CARE: PACU   PATIENT DISPOSITION:  PACU - hemodynamically stable  PROCEDURE DETAILS: The patient was taken to the operating room placed supine position where the area the abdomen was prepped and draped in usual sterile fashion. The area of the L4 disc was localized with lateral C-arm. Marked on the skin. Lateral transverse incision was made from the level of the midline laterally at this level. This incision was carried into the subcutaneous fat to expose the anterior rectus sheath. The anterior rectus sheath was opened in line with the skin incision. The rectus muscle was mobilized circumferentially. The retroperitoneum was opened laterally bluntly lateral to the rectus muscle below the level of the semilunar line. The peritoneum was mobilized off this and the posterior rectus sheath was opened laterally. Mobilization of intraperitoneal contents and ureter were continued to the right. The iliac artery and vein were identified. The iliolumbar vein was ligated with 2-0 silk ties. The vein was further mobilized to the right and there was a lumbar vein that was present and this was ligated with 2-0 silk ties and divided as well. This gave full mobility of the vein to move to the right. The per hour retractor was brought onto the field and the reverse lip 150 blades were placed to the right and left of the L4-5 disc. The 140 malleable retractors were used to retract superiorly and inferiorly. This  gave adequate exposure for surgery on the L4-5 disc. The remainder of the procedure will be dictated as a separate note by Dr. Serina Cowper, M.D. 07/15/2012 4:01 PM

## 2012-07-15 NOTE — Progress Notes (Signed)
A line d/cd with cath intact and pressure bqandage applied x 5 minutes

## 2012-07-15 NOTE — Preoperative (Signed)
Beta Blockers   Reason not to administer Beta Blockers:Not Applicable 

## 2012-07-15 NOTE — Brief Op Note (Signed)
07/15/2012  11:47 AM  PATIENT:  Latoya [name]  36 y.o. female  PRE-OPERATIVE DIAGNOSIS:  discogenic lumbar back pain with stenosis  POST-OPERATIVE DIAGNOSIS:  discogenic lumbar back pain with stenosis  PROCEDURE:  Procedure(s) (LRB): ANTERIOR LUMBAR FUSION 1 LEVEL (N/A) ABDOMINAL EXPOSURE (N/A)  SURGEON:  Surgeon(s) and Role: Panel 1:    * [name], MD - Primary  Panel 2:    * Larina Earthly, MD - Primary  PHYSICIAN ASSISTANT:    Dr Early - Approach Surgeon   ANESTHESIA:   general  EBL:  Total I/O In: 2500 [I.V.:2500] Out: 800 [Urine:500; Blood:300]  BLOOD ADMINISTERED:none  DRAINS: none   LOCAL MEDICATIONS USED:  NONE  SPECIMEN:  No Specimen  DISPOSITION OF SPECIMEN:  N/A  COUNTS:  YES  TOURNIQUET:  * No tourniquets in log *  DICTATION: .Other Dictation: Dictation Number [id number]  PLAN OF CARE: Admit to inpatient   PATIENT DISPOSITION:  PACU - hemodynamically stable.

## 2012-07-15 NOTE — H&P (Signed)
H+P reviewed No change to clinical exam 

## 2012-07-15 NOTE — Anesthesia Preprocedure Evaluation (Addendum)
Anesthesia Evaluation  Patient identified by MRN, date of birth, ID band Patient awake    Reviewed: Allergy & Precautions, H&P , NPO status , Patient's Chart, lab work & pertinent test results, reviewed documented beta blocker date and time   History of Anesthesia Complications (+) PONV and Family history of anesthesia reaction  Airway Mallampati: I TM Distance: >3 FB Neck ROM: Full    Dental No notable dental hx. (+) Teeth Intact   Pulmonary Current Smoker,  breath sounds clear to auscultation  Pulmonary exam normal       Cardiovascular + dysrhythmias Rhythm:Regular Rate:Normal  Hx - PAT   Neuro/Psych    GI/Hepatic GERD-  Medicated and Controlled,  Endo/Other  S/P Gastric Bypass  Renal/GU      Musculoskeletal   Abdominal   Peds  Hematology   Anesthesia Other Findings   Reproductive/Obstetrics negative OB ROS Hx/o Endometriosis                          Anesthesia Physical Anesthesia Plan  ASA: II  Anesthesia Plan: General   Post-op Pain Management:    Induction: Intravenous  Airway Management Planned: Oral ETT  Additional Equipment: Arterial line  Intra-op Plan:   Post-operative Plan: Extubation in OR  Informed Consent: I have reviewed the patients History and Physical, chart, labs and discussed the procedure including the risks, benefits and alternatives for the proposed anesthesia with the patient or authorized representative who has indicated his/her understanding and acceptance.   Dental advisory given  Plan Discussed with: CRNA, Anesthesiologist and Surgeon  Anesthesia Plan Comments:        Anesthesia Quick Evaluation

## 2012-07-15 NOTE — Anesthesia Procedure Notes (Signed)
Procedure Name: Intubation Date/Time: 07/15/2012 8:52 AM Performed by: Glendora Score A Pre-anesthesia Checklist: Patient identified, Emergency Drugs available, Suction available and Patient being monitored Patient Re-evaluated:Patient Re-evaluated prior to inductionOxygen Delivery Method: Circle system utilized Preoxygenation: Pre-oxygenation with 100% oxygen Intubation Type: IV induction Ventilation: Mask ventilation without difficulty Laryngoscope Size: Miller and 2 Grade View: Grade I Tube type: Oral Tube size: 7.0 mm Number of attempts: 1 Airway Equipment and Method: Stylet Placement Confirmation: ETT inserted through vocal cords under direct vision,  positive ETCO2 and breath sounds checked- equal and bilateral Secured at: 21 cm Tube secured with: Tape Dental Injury: Teeth and Oropharynx as per pre-operative assessment

## 2012-07-15 NOTE — Transfer of Care (Signed)
Immediate Anesthesia Transfer of Care Note  Patient: Latoya [name]  Procedure(s) Performed: Procedure(s) (LRB): ANTERIOR LUMBAR FUSION 1 LEVEL (N/A) ABDOMINAL EXPOSURE (N/A)  Patient Location: PACU  Anesthesia Type: General  Level of Consciousness: awake, alert  and oriented  Airway & Oxygen Therapy: Patient Spontanous Breathing and Patient connected to face mask oxygen  Post-op Assessment: Report given to PACU RN, Post -op Vital signs reviewed and stable, Patient moving all extremities and Patient moving all extremities X 4  Post vital signs: Reviewed and stable  Complications: No apparent anesthesia complications

## 2012-07-15 NOTE — Anesthesia Postprocedure Evaluation (Signed)
  Anesthesia Post-op Note  Patient: Latoya [name]  Procedure(s) Performed: Procedure(s) (LRB): ANTERIOR LUMBAR FUSION 1 LEVEL (N/A) ABDOMINAL EXPOSURE (N/A)  Patient Location: PACU  Anesthesia Type: General  Level of Consciousness: awake, alert  and oriented  Airway and Oxygen Therapy: Patient Spontanous Breathing  Post-op Pain: moderate  Post-op Assessment: Post-op Vital signs reviewed, Patient's Cardiovascular Status Stable, Respiratory Function Stable, Patent Airway, No signs of Nausea or vomiting and Pain level controlled  Post-op Vital Signs: Reviewed and stable  Complications: No apparent anesthesia complications

## 2012-07-16 ENCOUNTER — Inpatient Hospital Stay (HOSPITAL_COMMUNITY): Payer: 59

## 2012-07-16 ENCOUNTER — Encounter (HOSPITAL_COMMUNITY): Payer: Self-pay | Admitting: Radiology

## 2012-07-16 MED ORDER — BISACODYL 10 MG RE SUPP
10.0000 mg | Freq: Every day | RECTAL | Status: DC | PRN
  Administered 2012-07-16: 10 mg via RECTAL
  Filled 2012-07-16: qty 1

## 2012-07-16 MED ORDER — POLYETHYLENE GLYCOL 3350 17 G PO PACK
17.0000 g | PACK | Freq: Every day | ORAL | Status: DC
  Administered 2012-07-16: 17 g via ORAL
  Filled 2012-07-16 (×2): qty 1

## 2012-07-16 NOTE — Progress Notes (Signed)
    Subjective: Procedure(s) (LRB): ANTERIOR LUMBAR FUSION 1 LEVEL (N/A) ABDOMINAL EXPOSURE (N/A) 1 Day Post-Op  Patient reports pain as 4 on 0-10 scale.  Reports decreased leg pain reports incisional back pain   Positive void Negative bowel movement Positive flatus Negative chest pain or shortness of breath  Objective: Vital signs in last 24 hours: Temp:  [97 F (36.1 C)-98.4 F (36.9 C)] 98.4 F (36.9 C) (07/18 0603) Pulse Rate:  [62-83] 82  (07/18 0603) Resp:  [8-18] 18  (07/18 0733) BP: (114-136)/(62-76) 136/68 mmHg (07/18 0603) SpO2:  [2 %-100 %] 99 % (07/18 0603) Arterial Line BP: (132-134)/(61-64) 132/61 mmHg (07/17 1245) Weight:  [76.1 kg (167 lb 12.3 oz)] 76.1 kg (167 lb 12.3 oz) (07/17 1900)  Intake/Output from previous day: 07/17 0701 - 07/18 0700 In: 3230 [P.O.:580; I.V.:2500; IV Piggyback:150] Out: 4650 [Urine:4350; Blood:300]  Labs: No results found for this basename: WBC:2,RBC:2,HCT:2,PLT:2 in the last 72 hours No results found for this basename: NA:2,K:2,CL:2,CO2:2,BUN:2,CREATININE:2,GLUCOSE:2,CALCIUM:2 in the last 72 hours No results found for this basename: LABPT:2,INR:2 in the last 72 hours  Physical Exam: Neurologically intact ABD soft Neurovascular intact Intact pulses distally Incision: dressing C/D/I and no drainage Compartment soft  Assessment/Plan: Patient stable  xrays satisfactory placement of TDR Continue mobilization with physical therapy Continue care  Advance diet Up with therapy Plan for discharge tomorrow or [id number]  Latoya Lick, MD [facility] [phone]

## 2012-07-16 NOTE — Evaluation (Signed)
Occupational Therapy Evaluation and Discharge Patient Details Name: Latoya [name] MRN: [id number] DOB: 11-Aug-1976 Today's Date: 07/16/2012 Time: 7829-5621 OT Time Calculation (min): 12 min  OT Assessment / Plan / Recommendation Clinical Impression  This 36 yo female s/p back surgery presents to acute OT with all education completed and questions answered. She is aware of her back precautions. D/C from acute OT.    OT Assessment  Patient does not need any further OT services    Follow Up Recommendations  No OT follow up    Barriers to Discharge      Equipment Recommendations  None recommended by OT    Recommendations for Other Services    Frequency       Precautions / Restrictions Precautions Precautions: Back Precaution Booklet Issued: Yes (comment) Precaution Comments: Reviewed 3/3 back precautions. Required Braces or Orthoses: Spinal Brace Spinal Brace: Lumbar corset;Applied in sitting position Restrictions Weight Bearing Restrictions: No       ADL  Eating/Feeding: Simulated;Independent Where Assessed - Eating/Feeding: Edge of bed Transfers/Ambulation Related to ADLs: When I entered room pt was in the bathroom, asked her to pull her button when she was through and she said OK. Went back in about 5 minutes later and she was sitting on her bed eating lunch--appears to be no issue with ambulation ADL Comments: Pt reports that she is able to cross one leg over the other to donn/doff socks. Pt able to state 3/3 back precautions. Spoke with and demonstrated how pt should side step into the tub v. forward stepping into the tub. Spoke with patient how she should get up/down from the toilet at home with one hand on the sink and the other on her knee/thigh.  Informed pt that  wet wipes work better for toileting hygiene than toilet paper.    OT Diagnosis:    OT Problem List:   OT Treatment Interventions:     OT Goals    Visit Information  Last OT Received On:  07/16/12 Assistance Needed: +1    Subjective Data  Subjective: I'll probably have my boyfriend get one (hand held shower head) Patient Stated Goal: did not ask   Prior Functioning  Vision/Perception  Home Living Lives With: Significant other Available Help at Discharge: Family Type of Home: House Home Access: Stairs to enter Secretary/administrator of Steps: 5 Entrance Stairs-Rails: Right;Left;Can reach both Home Layout: One level Bathroom Shower/Tub: Forensic scientist: Standard (with vanity beside) Bathroom Accessibility: Yes Home Adaptive Equipment: Shower chair with back Prior Function Level of Independence: Independent Able to Take Stairs?: Yes Driving: Yes Vocation: Full time employment Comments: pt just recently got laid off Communication Communication: No difficulties Dominant Hand: Right      Cognition  Overall Cognitive Status: Appears within functional limits for tasks assessed/performed Arousal/Alertness: Awake/alert Orientation Level: Appears intact for tasks assessed Behavior During Session: [facility] for tasks performed    Extremity/Trunk Assessment Right Upper Extremity Assessment RUE ROM/Strength/Tone: Within functional levels Left Upper Extremity Assessment LUE ROM/Strength/Tone: Within functional levels Right Lower Extremity Assessment RLE ROM/Strength/Tone: Within functional levels Left Lower Extremity Assessment LLE ROM/Strength/Tone: Within functional levels   Mobility Bed Mobility Bed Mobility: Not assessed Transfers Sit to Stand: 4: Min guard;From bed Stand to Sit: 4: Min guard;To bed Details for Transfer Assistance: Minguard for safety    Exercise    Balance Balance Balance Assessed: Yes Static Sitting Balance Static Sitting - Balance Support: Feet supported Static Sitting - Level of Assistance: 5: Stand by assistance  End of Session OT - End of Session Equipment Utilized During Treatment:  (None) Activity Tolerance:  Patient tolerated treatment well Patient left: in bed (sitting EOB eating lunch)       Evette Georges [phone] 07/16/2012, 2:04 PM

## 2012-07-16 NOTE — Progress Notes (Signed)
Referral received for SNF. Chart reviewed;  CSW has spoken with RNCM who indicates that patient is for DC to home with no d/c needs per Physical Therapy recommendations.  CSW to sign off. Please re-consult if CSW needs arise.  Lorri Frederick. [place]  [phone]

## 2012-07-16 NOTE — Evaluation (Signed)
Physical Therapy Evaluation Patient Details Name: Latoya [name] MRN: [id number] DOB: [birth year] Today's Date: 07/16/2012 Time: 4098-1191 PT Time Calculation (min): 27 min  PT Assessment / Plan / Recommendation Clinical Impression  Pt is 36 y/o female admitted for s/p ALIF L4-L5.  Pt moving well however antalgic at times.  Pt will benefit from acute PT services to improve overall mobility.    PT Assessment  Patient needs continued PT services    Follow Up Recommendations  No PT follow up    Barriers to Discharge        Equipment Recommendations  None recommended by PT    Recommendations for Other Services     Frequency Min 5X/week    Precautions / Restrictions Precautions Precautions: Back;Fall Precaution Booklet Issued: Yes (comment) Precaution Comments: Reviewed 3/3 back precautions. Required Braces or Orthoses: Spinal Brace Spinal Brace: Applied in supine position;Lumbar corset Restrictions Weight Bearing Restrictions: No   Pertinent Vitals/Pain 5/10 back pain      Mobility  Bed Mobility Bed Mobility: Not assessed Transfers Transfers: Sit to Stand;Stand to Sit Sit to Stand: 4: Min guard;From bed Stand to Sit: 4: Min guard;To bed Details for Transfer Assistance: Minguard for safety  Ambulation/Gait Ambulation/Gait Assistance: 4: Min guard Ambulation Distance (Feet): 200 Feet Assistive device: None (occasional use of IV pole) Ambulation/Gait Assistance Details: Minguard for safety Gait Pattern: Decreased stride length;Shuffle;Wide base of support;Decreased trunk rotation;Antalgic Stairs: Yes Stairs Assistance: 4: Min guard Stair Management Technique: Two rails;Step to pattern;Forwards Number of Stairs: 3     Exercises     PT Diagnosis: Difficulty walking;Acute pain  PT Problem List: Decreased strength;Decreased activity tolerance;Decreased balance;Decreased knowledge of use of DME;Decreased mobility;Pain PT Treatment Interventions: DME instruction;Gait  training;Stair training;Functional mobility training;Therapeutic activities;Therapeutic exercise;Balance training;Patient/family education   PT Goals Acute Rehab PT Goals PT Goal Formulation: With patient Time For Goal Achievement: 07/23/12 Potential to Achieve Goals: Good Pt will Roll Supine to Left Side: with modified independence PT Goal: Rolling Supine to Left Side - Progress: Goal set today Pt will go Sit to Supine/Side: with modified independence PT Goal: Sit to Supine/Side - Progress: Goal set today Pt will go Sit to Stand: with modified independence PT Goal: Sit to Stand - Progress: Goal set today Pt will go Stand to Sit: with modified independence PT Goal: Stand to Sit - Progress: Goal set today Pt will Ambulate: >150 feet;with least restrictive assistive device PT Goal: Ambulate - Progress: Goal set today Pt will Go Up / Down Stairs: 3-5 stairs;with modified independence;with rail(s) PT Goal: Up/Down Stairs - Progress: Goal set today  Visit Information  Last PT Received On: 07/16/12 Assistance Needed: +1    Subjective Data  Subjective: "I'm doing well.  Just pain at incision." Patient Stated Goal: To go home   Prior Functioning  Home Living Lives With: Significant other Available Help at Discharge: Family Type of Home: House Home Access: Stairs to enter Secretary/administrator of Steps: 5 Entrance Stairs-Rails: Right;Left;Can reach both Home Layout: One level Bathroom Shower/Tub: Engineer, manufacturing systems: Standard Bathroom Accessibility: Yes Home Adaptive Equipment: None Prior Function Level of Independence: Independent Able to Take Stairs?: Yes Driving: Yes Vocation: Full time employment Comments: pt just recently got laid off Communication Communication: No difficulties    Cognition  Overall Cognitive Status: Appears within functional limits for tasks assessed/performed    Extremity/Trunk Assessment Right Lower Extremity Assessment RLE  ROM/Strength/Tone: Within functional levels Left Lower Extremity Assessment LLE ROM/Strength/Tone: Within functional levels   Balance Balance  Balance Assessed: Yes Static Sitting Balance Static Sitting - Balance Support: Feet supported Static Sitting - Level of Assistance: 5: Stand by assistance  End of Session PT - End of Session Equipment Utilized During Treatment: Gait belt;Back brace Activity Tolerance: Patient tolerated treatment well Patient left: in bed;with call bell/phone within reach Nurse Communication: Mobility status  GP     Dawaun Brancato 07/16/2012, 11:57 AM Jake Shark, PT DPT [phone]

## 2012-07-16 NOTE — Progress Notes (Signed)
UR COMPLETED  

## 2012-07-16 NOTE — Progress Notes (Signed)
[facility]  Subjective  - POD # 1  Anterior lumbar disc replacement with vascular abdominal exposure.  She has passed flatus and is taking po clears well.  Objective 136/68 82 98.4 F (36.9 C) (Oral) 18 99%  Intake/Output Summary (Last 24 hours) at 07/16/12 0936 Last data filed at 07/16/12 0700  Gross per 24 hour  Intake   3230 ml  Output   4650 ml  Net  -1420 ml    Abdomin soft, incision clean dry and intact. Distally N/V/M intact bilaterally  Assessment/Planning: POD #1 Anterior abdominal exposure for disc replacement No vomiting or abdominal distention We will assist you further with her care if need be thanks  Clinton Gallant [facility] 07/16/2012 9:36 AM --  Laboratory Lab Results: No results found for this basename: WBC:2,HGB:2,HCT:2,PLT:2 in the last 72 hours BMET No results found for this basename: NA:2,K:2,CL:2,CO2:2,GLUCOSE:2,BUN:2,CREATININE:2,CALCIUM:2 in the last 72 hours  COAG Lab Results  Component Value Date   INR 0.91 07/10/2012   No results found for this basename: PTT    Antibiotics Anti-infectives     Start     Dose/Rate Route Frequency Ordered Stop   07/15/12 1700   ceFAZolin (ANCEF) IVPB 1 g/50 mL premix        1 g 100 mL/hr over 30 Minutes Intravenous Every 8 hours 07/15/12 1631 07/16/12 0045   07/14/12 1516   ceFAZolin (ANCEF) IVPB 2 g/50 mL premix  Status:  Discontinued        2 g 100 mL/hr over 30 Minutes Intravenous 60 min pre-op 07/14/12 1516 07/15/12 1617

## 2012-07-16 NOTE — Op Note (Signed)
Latoya [name], Latoya [name]            ACCOUNT NO.:  [id number]  MEDICAL RECORD NO.:  [id number]  LOCATION:  5N06C                        FACILITY:  [facility]  PHYSICIAN:  Alvy Beal, MD    DATE OF BIRTH:  [birth year]  DATE OF PROCEDURE:  07/15/2012 DATE OF DISCHARGE:                              OPERATIVE REPORT   PREOPERATIVE DIAGNOSIS:  Discogenic lumbar back pain, L4-5.  POSTOPERATIVE DIAGNOSIS:  Discogenic lumbar back pain, L4-5.  OPERATIVE PROCEDURE:  Total disk arthroplasty, L4-5 approach.  SURGEON:  Dr. Maisie Fus Early.  HISTORY:  This is a very pleasant woman who has been having significant discogenic back pain for sometime.  Despite appropriate conservative management consisting of physical therapy, injection therapy, narcotic and nonnarcotic medications and observation and activity modification, she continued to have severe debilitating pain.  Preoperative diskogram confirmed the L4-5 level as her sole pain generator.  As a result of this and the failure of conservative management, we elected to proceed with a total disk arthroplasty at L4-5.  Again, all appropriate risks, benefits, and alternatives of the surgery were discussed with the patient and consent was obtained.  OPERATIVE NOTE:  The patient was brought to the operating room, placed supine on the operating table.  After successful induction of general anesthesia and endotracheal intubation, TEDs, SCDs, and a Foley were inserted.  The patient was placed in appropriate supine position, and the abdomen was prepped and draped in a standard fashion.  A time-out was done confirming patient, procedure, and all other pertinent important data.  Dr. Arbie Cookey then performed a standard anterior retroperitoneal approach to the spine via transverse incision.  Please refer to his dictation for specifics on the approach.  Once the [facility] retractor blades were properly positioned, I then confirmed the L4-5 level with lateral  fluoroscopy.  Once this was done, I then proceeded with the diskectomy.  An annulotomy was performed with a #10 blade scalpel and then using a combination of pituitary rongeurs, curettes, and Kerrison rongeurs, I removed all of the disk material.  I then used a fine nerve hook to release the posterior anulus from the L4 and L5 vertebral bodies.  I then used 2 and 3-mm Kerrison to trim down the posterior osteophytes from the endplates as well as removing the anulus.  At this point, under live fluoro, I was able to demonstrate parallel distraction of the endplates with no fishmouthing.  Once I had confirmed this, I then rasped the endplates until I had bleeding subchondral bone.  I irrigated the disk space copiously with normal saline and then with a trial device, placed a 10 medium 6-degree lordotic ProDisc-L trial. This provided good positioning, it did not over-distract the disk space. I was able to also to fit the prosthesis posteriorly just so it rested on the endplate of L5.  At this point, I was very pleased with the AP and lateral x-rays of the device.  I then used the chisel to create the cuts and the endplates for the shin.  Once this was done, I removed the trialing device, irrigated, and then obtained the actual implant.  This was then [facility] to the appropriate depth.  I then placed the plastic core.  There was no step-off or gap noted.  At this point, I removed the inserting devices, irrigated it copiously with normal saline.  I obtained hemostasis using bone wax.  At this point, the disk was placed. The retraction time total was approximately 1 hour on the iliac vessels. As I was removing the retractors, I noted some slight bleeding.  I placed some FloSeal and put pressure and then Dr. Arbie Cookey scrubbed back in to evaluate the bleeding.  At that point, he felt that it was not significant and in fact had stopped.  So, we removed all the retractors. Final x-rays were taken,  which were satisfactory.  I then irrigated it copiously with normal saline and then closed the fascia of the rectus with interrupted #1 Prolene runner, running stitch with a deep layer of 0 running Vicryl sutures, superficial with 2-0 Vicryl sutures and the skin with 3-0 Monocryl.  Steri-Strips and dry dressing were done.  At the end of the case, needle and sponge counts were correct, and a final AP x-ray was done to confirm that there was no retained surgical instrumentation in the wound.     Alvy Beal, MD     DDB/MEDQ  D:  07/15/2012  T:  07/16/2012  Job:  [id number]  cc:   Dr. Toniann Fail

## 2012-07-17 LAB — TYPE AND SCREEN
ABO/RH(D): AB NEG
Unit division: 0

## 2012-07-17 MED ORDER — OXYCODONE-ACETAMINOPHEN 10-325 MG PO TABS: 1.0000 | ORAL_TABLET | ORAL | Status: AC | PRN

## 2012-07-17 MED ORDER — IBUPROFEN 200 MG PO TABS: 200.0000 mg | ORAL_TABLET | Freq: Three times a day (TID) | ORAL | Status: DC | PRN

## 2012-07-17 MED ORDER — ENOXAPARIN SODIUM 40 MG/0.4ML ~~LOC~~ SOLN: 40.0000 mg | SUBCUTANEOUS | Status: DC

## 2012-07-17 MED ORDER — POLYETHYLENE GLYCOL 3350 17 G PO PACK: 17.0000 g | PACK | Freq: Every day | ORAL | Status: AC

## 2012-07-17 MED ORDER — METHOCARBAMOL 500 MG PO TABS: 500.0000 mg | ORAL_TABLET | Freq: Three times a day (TID) | ORAL | Status: DC | PRN

## 2012-07-17 NOTE — Progress Notes (Signed)
Physical Therapy Treatment/ Discharge note Patient Details Name: Latoya [name] MRN: [id number] DOB: October 14, 1976 Today's Date: 07/17/2012 Time: 5784-6962 PT Time Calculation (min): 11 min  PT Assessment / Plan / Recommendation Comments on Treatment Session  Pt s/p ALIF who has met all goals and mobilizing without assist. Pt completed all education and safe for discharge home.     Follow Up Recommendations       Barriers to Discharge        Equipment Recommendations       Recommendations for Other Services    Frequency     Plan Discharge plan remains appropriate    Precautions / Restrictions Precautions Precautions: Back Precaution Comments: pt able to state all precautions independently Spinal Brace: Lumbar corset Restrictions Weight Bearing Restrictions: No   Pertinent Vitals/Pain 2/10 incisional pain    Mobility  Bed Mobility Bed Mobility: Rolling Left;Left Sidelying to Sit;Sit to Sidelying Left Rolling Left: 7: Independent Left Sidelying to Sit: 7: Independent;HOB flat Sit to Sidelying Left: 7: Independent;HOB flat Transfers Transfers: Sit to Stand;Stand to Sit Sit to Stand: 6: Modified independent (Device/Increase time);From bed;From chair/3-in-1 Stand to Sit: 6: Modified independent (Device/Increase time);To bed;To chair/3-in-1 Ambulation/Gait Ambulation/Gait Assistance: 7: Independent Ambulation Distance (Feet): 300 Feet Assistive device: None Gait Pattern: Within Functional Limits Stairs: Yes Stairs Assistance: 6: Modified independent (Device/Increase time) Stair Management Technique: One rail Right Number of Stairs: 5     Exercises     PT Diagnosis:    PT Problem List:   PT Treatment Interventions:     PT Goals Acute Rehab PT Goals PT Goal: Rolling Supine to Left Side - Progress: Met PT Goal: Sit to Supine/Side - Progress: Met PT Goal: Sit to Stand - Progress: Met PT Goal: Stand to Sit - Progress: Met PT Goal: Ambulate - Progress: Met PT Goal:  Up/Down Stairs - Progress: Met  Visit Information  Last PT Received On: 07/17/12 Assistance Needed: +1    Subjective Data  Subjective: just pain at the incision everything else is good   Cognition  Overall Cognitive Status: Appears within functional limits for tasks assessed/performed Arousal/Alertness: Awake/alert Orientation Level: Appears intact for tasks assessed Behavior During Session: [facility] for tasks performed    Balance     End of Session PT - End of Session Equipment Utilized During Treatment: Back brace Activity Tolerance: Patient tolerated treatment well Patient left: in chair;with call bell/phone within reach Nurse Communication: Mobility status   GP     Delorse Lek 07/17/2012, 9:43 AM Delaney Meigs, PT [phone]

## 2012-07-17 NOTE — Progress Notes (Signed)
    Subjective: Procedure(s) (LRB): ANTERIOR LUMBAR FUSION 1 LEVEL (N/A) ABDOMINAL EXPOSURE (N/A) 2 Days Post-Op  Patient reports pain as 3 on 0-10 scale.  Reports decreased leg pain denies incisional back pain   Positive void Positive bowel movement Positive flatus Negative chest pain or shortness of breath  Objective: Vital signs in last 24 hours: Temp:  [97.1 F (36.2 C)-98.2 F (36.8 C)] 97.1 F (36.2 C) (07/19 0631) Pulse Rate:  [76-93] 76  (07/19 0631) Resp:  [18] 18  (07/19 0631) BP: (117-143)/(67-69) 127/68 mmHg (07/19 0631) SpO2:  [98 %-100 %] 98 % (07/19 0631)  Intake/Output from previous day:    Labs: No results found for this basename: WBC:2,RBC:2,HCT:2,PLT:2 in the last 72 hours No results found for this basename: NA:2,K:2,CL:2,CO2:2,BUN:2,CREATININE:2,GLUCOSE:2,CALCIUM:2 in the last 72 hours No results found for this basename: LABPT:2,INR:2 in the last 72 hours  Physical Exam: Neurologically intact ABD soft Neurovascular intact Intact pulses distally Incision: dressing C/D/I and no drainage Compartment soft  Assessment/Plan: Patient stable  xrays satisfactory Continue mobilization with physical therapy Continue care  Discharge home with home health  [name], MD [facility] [phone]

## 2012-07-17 NOTE — Progress Notes (Signed)
Lovenox teaching done with Pt. Pt verbalized understanding of how to admin injection.

## 2012-07-21 NOTE — Discharge Summary (Signed)
Patient ID: Latoya [name] MRN: [id number] DOB/AGE: August 25, 1976 36 y.o.  Admit date: 07/15/2012 Discharge date: 07/17/2012 .   Admission Diagnoses:  Discogenic lumbar back pain, L4-5.    Discharge Diagnoses:  Discogenic lumbar back pain, L4-5.  OPERATIVE PROCEDURE: Total disk arthroplasty, L4-5 approach.   Past Medical History  Diagnosis Date  . Endometriosis 2000  . PONV (postoperative nausea and vomiting)   . PAT (paroxysmal atrial tachycardia)     occ if stressed,no tests in 17 yrs  . UTI (lower urinary tract infection)     "more when I was younger; not bad since hysterectomy"  . GERD (gastroesophageal reflux disease)   . Anemia     hx  . Family history of anesthesia complication     Mother nauseated w/ some aggression after.  Marland Kitchen PIH (pregnancy induced hypertension) 1996    "only when I'm pregnant"  . History of blood transfusion 11/2002    "for bleeding ulcer"  . Bleeding gastric ulcer 11/2002    post gastric bypass  . Migraines     "used to have often; now maybe 1-2/yr" (07/15/12)  . Chronic lower back pain     Surgeries: Procedure(s): ANTERIOR LUMBAR FUSION 1 LEVEL ABDOMINAL EXPOSURE on 07/15/2012   Consultants: Treatment Team:  Larina Earthly, MD  Discharged Condition: Improved  Hospital Course: Latoya [name] is an 36 y.o. female who was admitted 07/15/2012 for operative treatment of discogenic lumbar back pain, L4-5.  Patient failed conservative treatments (please see the history and physical for the specifics) and had severe unremitting pain that affects sleep, daily activities and work/hobbies. After pre-op clearance, the patient was taken to the operating room on 07/15/2012 and underwent  Procedure(s): ANTERIOR LUMBAR FUSION 1 LEVEL ABDOMINAL EXPOSURE.    Patient was given perioperative antibiotics:  Anti-infectives     Start     Dose/Rate Route Frequency Ordered Stop   07/15/12 1700   ceFAZolin (ANCEF) IVPB 1 g/50 mL premix        1 g 100  mL/hr over 30 Minutes Intravenous Every 8 hours 07/15/12 1631 07/16/12 0045   07/14/12 1516   ceFAZolin (ANCEF) IVPB 2 g/50 mL premix  Status:  Discontinued        2 g 100 mL/hr over 30 Minutes Intravenous 60 min pre-op 07/14/12 1516 07/15/12 1617           Patient was given sequential compression devices and early ambulation to prevent DVT.   Patient benefited maximally from hospital stay and there were no complications. At the time of discharge, the patient was urinating/moving their bowels without difficulty, tolerating a regular diet, pain is controlled with oral pain medications and they have been cleared by PT/OT.   Recent vital signs: No data found.    Recent laboratory studies: No results found for this basename: WBC:2,HGB:2,HCT:2,PLT:2,NA:2,K:2,CL:2,CO2:2,BUN:2,CREATININE:2,GLUCOSE:2,PT:2,INR:2,CALCIUM,2: in the last 72 hours   Discharge Medications:   Medication List  As of 07/21/2012 11:43 AM   TAKE these medications         cetirizine 10 MG tablet   Commonly known as: ZYRTEC   Take 10 mg by mouth daily.      enoxaparin 40 MG/0.4ML injection   Commonly known as: LOVENOX   Inject 0.4 mLs (40 mg total) into the skin daily.      estradiol 1 MG tablet   Commonly known as: ESTRACE   Take 1 tablet (1 mg total) by mouth daily.      ibuprofen 200 MG  tablet   Commonly known as: ADVIL,MOTRIN   Take 1 tablet (200 mg total) by mouth every 8 (eight) hours as needed for pain (swelling and pain.  start after lovenox is completed).      methocarbamol 500 MG tablet   Commonly known as: ROBAXIN   Take 1 tablet (500 mg total) by mouth 3 (three) times daily as needed.      omeprazole 20 MG capsule   Commonly known as: PRILOSEC   Take 20 mg by mouth daily.      oxyCODONE-acetaminophen 10-325 MG per tablet   Commonly known as: PERCOCET   Take 1 tablet by mouth every 4 (four) hours as needed for pain.            Diagnostic Studies:  Chest 2 View  07/10/2012  *RADIOLOGY  REPORT*  Clinical Data: Preoperative respiratory exam for spine surgery.  CHEST - 2 VIEW  Comparison: 01/06/2008  Findings: Heart size is normal.  Mediastinal shadows are normal. The lungs are clear.  No effusions.  No bony abnormalities.  IMPRESSION: Normal chest  Original Report Authenticated By: Thomasenia Sales, M.D.   Dg Lumbar Spine 2-3 Views  07/16/2012  *RADIOLOGY REPORT*  Clinical Data: Postop lumbar surgery.  LUMBAR SPINE - 2-3 VIEW  Comparison: 07/15/2012  Findings: Changes of lumbar fusion at L4-5.  Normal alignment.  No hardware complicating feature.  SI joints are symmetric and unremarkable.  IMPRESSION: Postop changes at L4-5.  No complicating feature.  Original Report Authenticated By: Cyndie Chime, M.D.   Dg Lumbar Spine 2-3 Views  07/15/2012  *RADIOLOGY REPORT*  Clinical Data: Back pain  DG C-ARM 61-120 MIN,LUMBAR SPINE - 2-3 VIEW  Comparison: Multiple priors.  Findings: C-arm films document placement of artificial disc at L4- L5 via anterior lumbar approach.  IMPRESSION: As above.  Original Report Authenticated By: Elsie Stain, M.D.   Dg Lumbar Spine 2-3 Views  07/10/2012  *RADIOLOGY REPORT*  Clinical Data: Preop back surgery.  Label lumbar levels.  LUMBAR SPINE - 2-3 VIEWa  Comparison: CT lumbar spine 06/12/2012  Findings: Five non-rib bearing lumbar segments are present.  The lowest disc space is assigned L5-S1.  Normal lumbar alignment.  Negative for fracture or mass.  Posterior annular calcification at L4-5.  Disc spaces are maintained.  IMPRESSION: Disc degeneration L4-5.  Original Report Authenticated By: Camelia Phenes, M.D.   Dg Abd 1 View  07/15/2012  *RADIOLOGY REPORT*  Clinical Data: Lumbar anterior arthroplasty.  ABDOMEN - 1 VIEW  Comparison: None.  Findings: Changes of lumbar fusion at L4-5.  Surgical clips are noted over the left iliac crest.  No unexpected radiopaque foreign bodies or surgical instruments seen.  Normal bowel gas pattern.  IMPRESSION: Changes of lower  lumbar fusion.  No unexpected radiopaque foreign bodies.  Original Report Authenticated By: Cyndie Chime, M.D.   Dg C-arm 61-120 Min  07/15/2012  *RADIOLOGY REPORT*  Clinical Data: Back pain  DG C-ARM 61-120 MIN,LUMBAR SPINE - 2-3 VIEW  Comparison: Multiple priors.  Findings: C-arm films document placement of artificial disc at L4- L5 via anterior lumbar approach.  IMPRESSION: As above.  Original Report Authenticated By: Elsie Stain, M.D.        Discharge Plan:  discharge to Home   Disposition: stable at the time of discharge    Signed: Gwinda Maine for Dr. [name] [facility] [phone] 07/21/2012, 11:43 AM

## 2013-02-07 ENCOUNTER — Emergency Department (HOSPITAL_COMMUNITY)
Admission: EM | Admit: 2013-02-07 | Discharge: 2013-02-07 | Disposition: A | Discharge status: Home / Self Care | Payer: Self-pay | Attending: Emergency Medicine | Admitting: Emergency Medicine

## 2013-02-07 ENCOUNTER — Encounter (HOSPITAL_COMMUNITY): Payer: Self-pay | Admitting: Emergency Medicine

## 2013-02-07 DIAGNOSIS — Z8679 Personal history of other diseases of the circulatory system: Secondary | ICD-10-CM | POA: Insufficient documentation

## 2013-02-07 DIAGNOSIS — W1809XA Striking against other object with subsequent fall, initial encounter: Secondary | ICD-10-CM | POA: Insufficient documentation

## 2013-02-07 DIAGNOSIS — Z8742 Personal history of other diseases of the female genital tract: Secondary | ICD-10-CM | POA: Insufficient documentation

## 2013-02-07 DIAGNOSIS — Z862 Personal history of diseases of the blood and blood-forming organs and certain disorders involving the immune mechanism: Secondary | ICD-10-CM | POA: Insufficient documentation

## 2013-02-07 DIAGNOSIS — K219 Gastro-esophageal reflux disease without esophagitis: Secondary | ICD-10-CM | POA: Insufficient documentation

## 2013-02-07 DIAGNOSIS — S0181XA Laceration without foreign body of other part of head, initial encounter: Secondary | ICD-10-CM

## 2013-02-07 DIAGNOSIS — Z9884 Bariatric surgery status: Secondary | ICD-10-CM | POA: Insufficient documentation

## 2013-02-07 DIAGNOSIS — S01409A Unspecified open wound of unspecified cheek and temporomandibular area, initial encounter: Secondary | ICD-10-CM | POA: Insufficient documentation

## 2013-02-07 DIAGNOSIS — Y9389 Activity, other specified: Secondary | ICD-10-CM | POA: Insufficient documentation

## 2013-02-07 DIAGNOSIS — G8929 Other chronic pain: Secondary | ICD-10-CM | POA: Insufficient documentation

## 2013-02-07 DIAGNOSIS — Z87440 Personal history of urinary (tract) infections: Secondary | ICD-10-CM | POA: Insufficient documentation

## 2013-02-07 DIAGNOSIS — Z8711 Personal history of peptic ulcer disease: Secondary | ICD-10-CM | POA: Insufficient documentation

## 2013-02-07 DIAGNOSIS — Z79899 Other long term (current) drug therapy: Secondary | ICD-10-CM | POA: Insufficient documentation

## 2013-02-07 DIAGNOSIS — Z87891 Personal history of nicotine dependence: Secondary | ICD-10-CM | POA: Insufficient documentation

## 2013-02-07 DIAGNOSIS — Y92009 Unspecified place in unspecified non-institutional (private) residence as the place of occurrence of the external cause: Secondary | ICD-10-CM | POA: Insufficient documentation

## 2013-02-07 NOTE — ED Provider Notes (Signed)
History    This chart was scribed for non-physician practitioner working with Gwyneth Sprout, MD by Leone Payor, ED Scribe. This patient was seen in room [facility] and the patient's care was started at 2104.   CSN: [id number]  Arrival date & time 02/07/13  2104   First MD Initiated Contact with Patient 02/07/13 2155      Chief Complaint  Patient presents with  . Fall  . Facial Injury     The history is provided by the patient. No language interpreter was used.    Latoya [name] is a 37 y.o. female who presents to the Emergency Department complaining of a new, unchanged laceration to the right face after falling from being intoxicated about 1 hour ago. Pt reports hitting her face on a window sill of a brick house. She reports associated bleeding after the fall. Patient denies pain. She denies LOC and bleeding is controlled. She denies neck pain, nausea, vomiting.   Pt is a former smoker but denies alcohol use.   Past Medical History  Diagnosis Date  . Endometriosis 2000  . PONV (postoperative nausea and vomiting)   . PAT (paroxysmal atrial tachycardia)     occ if stressed,no tests in 17 yrs  . UTI (lower urinary tract infection)     "more when I was younger; not bad since hysterectomy"  . GERD (gastroesophageal reflux disease)   . Anemia     hx  . Family history of anesthesia complication     Mother nauseated w/ some aggression after.  Marland Kitchen PIH (pregnancy induced hypertension) 1996    "only when I'm pregnant"  . History of blood transfusion 11/2002    "for bleeding ulcer"  . Bleeding gastric ulcer 11/2002    post gastric bypass  . Migraines     "used to have often; now maybe 1-2/yr" (07/15/12)  . Chronic lower back pain     Past Surgical History  Procedure Laterality Date  . Vaginal hysterectomy  2007    [facility]  . Cesarean section  1996  . Tubal ligation  03  . Carpal tunnel release  2001?    right  . Pelvic laparoscopy  2000    endometriosis  . Anterior lumbar  fusion  07/15/12    abdominal exposure  . Salpingoophorectomy  2007; 2009    left; right  . Roux-en-y gastric bypass  2003  . Anterior lumbar fusion  07/15/2012    Procedure: ANTERIOR LUMBAR FUSION 1 LEVEL;  Surgeon: [name], MD;  Location: [facility] OR;  Service: Orthopedics;  Laterality: N/A;  total disc replacement versus ALIF L4-5    Family History  Problem Relation Age of Onset  . Diabetes Mother   . Hypertension Mother   . Stroke Mother     mini  . Hypertension Father   . Breast cancer Maternal Aunt 51    History  Substance Use Topics  . Smoking status: Former Smoker -- 1.00 packs/day for 20 years    Types: Cigarettes    Quit date: 07/15/2012  . Smokeless tobacco: Never Used  . Alcohol Use: 3.0 oz/week    5 Glasses of wine per week    OB History   Grav Para Term Preterm Abortions TAB SAB Ect Mult Living   1 1        1       Review of Systems A complete 10 system review of systems was obtained and all systems are negative except as noted in the HPI and PMH.  Allergies  Codeine  Home Medications   Current Outpatient Rx  Name  Route  Sig  Dispense  Refill  . cetirizine (ZYRTEC) 10 MG tablet   Oral   Take 10 mg by mouth daily.         Marland Kitchen estradiol (ESTRACE) 1 MG tablet   Oral   Take 1 tablet (1 mg total) by mouth daily.   30 tablet   11     Annual exam due.  Needs ov   . ibuprofen (ADVIL,MOTRIN) 200 MG tablet   Oral   Take 1 tablet (200 mg total) by mouth every 8 (eight) hours as needed for pain (swelling and pain.  start after lovenox is completed).   90 tablet   2   . omeprazole (PRILOSEC) 20 MG capsule   Oral   Take 20 mg by mouth daily.           BP 141/76  Temp(Src) 98.4 F (36.9 C) (Oral)  SpO2 100%  LMP 12/30/2005  Physical Exam  Nursing note and vitals reviewed. Constitutional: She is oriented to person, place, and time. She appears well-developed and well-nourished. No distress.  HENT:  Head: Normocephalic and atraumatic.   Eyes: Conjunctivae and EOM are normal. Pupils are equal, round, and reactive to light.  Neck: Normal range of motion. Neck supple. No tracheal deviation present.  Cardiovascular: Normal rate.   Pulmonary/Chest: Effort normal. No respiratory distress.  Musculoskeletal: Normal range of motion.  Neurological: She is alert and oriented to person, place, and time.  Facial sensation intact.   Skin: Skin is warm and dry.  2cm laceration lateral to right nare. Bleeding controlled. No peri orbital swelling under eyes.   Psychiatric: She has a normal mood and affect. Her behavior is normal.    ED Course  Procedures (including critical care time)  LACERATION REPAIR Performed by: Emilia Beck Authorized by: Emilia Beck Consent: Verbal consent obtained. Risks and benefits: risks, benefits and alternatives were discussed Consent given by: patient Patient identity confirmed: provided demographic data Prepped and Draped in normal sterile fashion Wound explored  Laceration Location: right cheek, just lateral to right nare  Laceration Length: 1.5 cm  No Foreign Bodies seen or palpated  Anesthesia:none  Local anesthetic:none Anesthetic total: 0 ml  Irrigation method: wound cleanser Amount of cleaning: standard  Skin closure: dermabond  Number of sutures: n/a  Technique: n/a  Patient tolerance: Patient tolerated the procedure well with no immediate complications.   DIAGNOSTIC STUDIES: Oxygen Saturation is 100% on room air, normal by my interpretation.    COORDINATION OF CARE: 10:40 PM Discussed treatment plan which includes laceration repair with Dermabond with pt at bedside and pt agreed to plan.    Labs Reviewed - No data to display No results found.   1. Facial laceration       MDM  10:55 PM Laceration repaired without difficulty. Patient declines pain medication. Patient instructed to return with worsening or concerning symptoms.      I personally  performed the services described in this documentation, which was scribed in my presence. The recorded information has been reviewed and is accurate.   Emilia Beck, PA-C 02/07/13 2259

## 2013-02-07 NOTE — ED Notes (Signed)
Pt reports she is intoxicated and fell approx 1 hour ago.  Denies LOC.  Denies neck or back pain.  Pt has abrasions and approx 1 cm laceration to R side of face.  Bleeding controlled.  Denies pain.

## 2013-02-08 NOTE — ED Provider Notes (Signed)
Medical screening examination/treatment/procedure(s) were performed by non-physician practitioner and as supervising physician I was immediately available for consultation/collaboration.   Gwyneth Sprout, MD 02/08/13 [phone]

## 2013-03-31 ENCOUNTER — Other Ambulatory Visit: Payer: Self-pay

## 2013-04-02 ENCOUNTER — Encounter: Payer: Self-pay | Admitting: *Deleted

## 2013-04-02 NOTE — Progress Notes (Signed)
Patient ID: Latoya [name], female   DOB: 14-Aug-1976, 37 y.o.   MRN: [id number] Pt called asking why rx for estradiol was denied, pt overdue for annual. I left message on pt voicemail to make OV and then we can send refill in.

## 2013-04-05 ENCOUNTER — Telehealth: Payer: Self-pay | Admitting: *Deleted

## 2013-04-05 MED ORDER — ESTRADIOL 1 MG PO TABS: 1.0000 mg | ORAL_TABLET | Freq: Every day | ORAL | Status: DC

## 2013-04-05 NOTE — Telephone Encounter (Signed)
Pt has annual scheduled on 04/28/13 requesting refill on estrace 1 mg. rx sent.

## 2013-04-28 ENCOUNTER — Ambulatory Visit (INDEPENDENT_AMBULATORY_CARE_PROVIDER_SITE_OTHER): Payer: Self-pay | Admitting: Gynecology

## 2013-04-28 ENCOUNTER — Encounter: Payer: Self-pay | Admitting: Gynecology

## 2013-04-28 VITALS — BP 130/80 | Ht 66.0 in | Wt 176.0 lb

## 2013-04-28 DIAGNOSIS — R3915 Urgency of urination: Secondary | ICD-10-CM

## 2013-04-28 DIAGNOSIS — Z01419 Encounter for gynecological examination (general) (routine) without abnormal findings: Secondary | ICD-10-CM

## 2013-04-28 DIAGNOSIS — Z79890 Hormone replacement therapy (postmenopausal): Secondary | ICD-10-CM

## 2013-04-28 MED ORDER — ESTRADIOL 1 MG PO TABS: 1.0000 mg | ORAL_TABLET | Freq: Every day | ORAL | Status: DC

## 2013-04-28 NOTE — Patient Instructions (Signed)
Consider Stop Smoking.  Help is available at [facility]'s smoking cessation program @ www.[place].com or 336-832-0838. OR 1-800-QUIT-NOW (1-800-784-8669) for free smoking cessation counseling.  Smokefree.gov (http://www.smokefree.gov) provides free, accurate, evidence-based information and professional assistance to help support the immediate and long-term needs of people trying to quit smoking.    Smoking Hazards Smoking cigarettes is extremely bad for your health. Tobacco smoke has over 200 known poisons in it. There are over 60 chemicals in tobacco smoke that cause cancer. Some of the chemicals found in cigarette smoke include:  Cyanide.  Benzene.  Formaldehyde.  Methanol (wood alcohol).  Acetylene (fuel used in welding torches).  Ammonia.  Cigarette smoke also contains the poisonous gases nitrogen oxide and carbon monoxide.  Cigarette smokers have an increased risk of many serious medical problems, including: Lung cancer.  Lung disease (such as pneumonia, bronchitis, and emphysema).  Heart attack and chest pain due to the heart not getting enough oxygen (angina).  Heart disease and peripheral blood vessel disease.  Hypertension.  Stroke.  Oral cancer (cancer of the lip, mouth, or voice box).  Bladder cancer.  Pancreatic cancer.  Cervical cancer.  Pregnancy complications, including premature birth.  Low birthweight babies.  Early menopause.  Lower estrogen level for women.  Infertility.  Facial wrinkles.  Blindness.  Increased risk of broken bones (fractures).  Senile dementia.  Stillbirths and smaller newborn babies, birth defects, and genetic damage to sperm.  Stomach ulcers and internal bleeding.  Children of smokers have an increased risk of the following, because of secondhand smoke exposure:  Sudden infant death syndrome (SIDS).  Respiratory infections.  Lung cancer.  Heart disease.  Ear infections.  Smoking causes approximately: 90% of all lung cancer  deaths in men.  80% of all lung cancer deaths in women.  90% of deaths from chronic obstructive lung disease.  Compared with nonsmokers, smoking increases the risk of: Coronary heart disease by 2 to 4 times.  Stroke by 2 to 4 times.  Men developing lung cancer by 23 times.  Women developing lung cancer by 13 times.  Dying from chronic obstructive lung diseases by 12 times.  Someone who smokes 2 packs a day loses about 8 years of his or her life. Even smoking lightly shortens your life expectancy by several years. You can greatly reduce the risk of medical problems for you and your family by stopping now. Smoking is the most preventable cause of death and disease in our society. Within days of quitting smoking, your circulation returns to normal, you decrease the risk of having a heart attack, and your lung capacity improves. There may be some increased phlegm in the first few days after quitting, and it may take months for your lungs to clear up completely. Quitting for 10 years cuts your lung cancer risk to almost that of a nonsmoker. WHY IS SMOKING ADDICTIVE? Nicotine is the chemical agent in tobacco that is capable of causing addiction or dependence.  When you smoke and inhale, nicotine is absorbed rapidly into the bloodstream through your lungs. Nicotine absorbed through the lungs is capable of creating a powerful addiction. Both inhaled and non-inhaled nicotine may be addictive.  Addiction studies of cigarettes and spit tobacco show that addiction to nicotine occurs mainly during the teen years, when young people begin using tobacco products.  WHAT ARE THE BENEFITS OF QUITTING?  There are many health benefits to quitting smoking.  Likelihood of developing cancer and heart disease decreases. Health improvements are seen almost immediately.    Blood pressure, pulse rate, and breathing patterns start returning to normal soon after quitting.  People who quit may see an improvement in their overall  quality of life.  Some people choose to quit all at once. Other options include nicotine replacement products, such as patches, gum, and nasal sprays. Do not use these products without first checking with your caregiver. QUITTING SMOKING It is not easy to quit smoking. Nicotine is addicting, and longtime habits are hard to change. To start, you can write down all your reasons for quitting, tell your family and friends you want to quit, and ask for their help. Throw your cigarettes away, chew gum or cinnamon sticks, keep your hands busy, and drink extra water or juice. Go for walks and practice deep breathing to relax. Think of all the money you are saving: around $1,000 a year, for the average pack-a-day smoker. Nicotine patches and gum have been shown to improve success at efforts to stop smoking. Zyban (bupropion) is an anti-depressant drug that can be prescribed to reduce nicotine withdrawal symptoms and to suppress the urge to smoke. Smoking is an addiction with both physical and psychological effects. Joining a stop-smoking support group can help you cope with the emotional issues. For more information and advice on programs to stop smoking, call your doctor, your local hospital, or these organizations: American Lung Association - 1-800-LUNGUSA   Smoking Cessation  This document explains the best ways for you to quit smoking and new treatments to help. It lists new medicines that can double or triple your chances of quitting and quitting for good. It also considers ways to avoid relapses and concerns you may have about quitting, including weight gain. NICOTINE: A POWERFUL ADDICTION If you have tried to quit smoking, you know how hard it can be. It is hard because nicotine is a very addictive drug. For some people, it can be as addictive as heroin or cocaine. Usually, people make 2 or 3 tries, or more, before finally being able to quit. Each time you try to quit, you can learn about what helps and  what hurts. Quitting takes hard work and a lot of effort, but you can quit smoking. QUITTING SMOKING IS ONE OF THE MOST IMPORTANT THINGS YOU WILL EVER DO.  You will live longer, feel better, and live better.   The impact on your body of quitting smoking is felt almost immediately:   Within 20 minutes, blood pressure decreases. Pulse returns to its normal level.   After 8 hours, carbon monoxide levels in the blood return to normal. Oxygen level increases.   After 24 hours, chance of heart attack starts to decrease. Breath, hair, and body stop smelling like smoke.   After 48 hours, damaged nerve endings begin to recover. Sense of taste and smell improve.   After 72 hours, the body is virtually free of nicotine. Bronchial tubes relax and breathing becomes easier.   After 2 to 12 weeks, lungs can hold more air. Exercise becomes easier and circulation improves.   Quitting will reduce your risk of having a heart attack, stroke, cancer, or lung disease:   After 1 year, the risk of coronary heart disease is cut in half.   After 5 years, the risk of stroke falls to the same as a nonsmoker.   After 10 years, the risk of lung cancer is cut in half and the risk of other cancers decreases significantly.   After 15 years, the risk of coronary heart disease drops, usually   to the level of a nonsmoker.   If you are pregnant, quitting smoking will improve your chances of having a healthy baby.   The people you live with, especially your children, will be healthier.   You will have extra money to spend on things other than cigarettes.  FIVE KEYS TO QUITTING Studies have shown that these 5 steps will help you quit smoking and quit for good. You have the best chances of quitting if you use them together: 1. Get ready.  2. Get support and encouragement.  3. Learn new skills and behaviors.  4. Get medicine to reduce your nicotine addiction and use it correctly.  5. Be prepared for relapse or  difficult situations. Be determined to continue trying to quit, even if you do not succeed at first.  1. GET READY  Set a quit date.   Change your environment.   Get rid of ALL cigarettes, ashtrays, matches, and lighters in your home, car, and place of work.   Do not let people smoke in your home.   Review your past attempts to quit. Think about what worked and what did not.   Once you quit, do not smoke. NOT EVEN A PUFF!  2. GET SUPPORT AND ENCOURAGEMENT Studies have shown that you have a better chance of being successful if you have help. You can get support in many ways.  Tell your family, friends, and coworkers that you are going to quit and need their support. Ask them not to smoke around you.   Talk to your caregivers (doctor, dentist, nurse, pharmacist, psychologist, and/or smoking counselor).   Get individual, group, or telephone counseling and support. The more counseling you have, the better your chances are of quitting. Programs are available at local hospitals and health centers. Call your local health department for information about programs in your area.   Spiritual beliefs and practices may help some smokers quit.   Quit meters are small computer programs online or downloadable that keep track of quit statistics, such as amount of "quit-time," cigarettes not smoked, and money saved.   Many smokers find one or more of the many self-help books available useful in helping them quit and stay off tobacco.  3. LEARN NEW SKILLS AND BEHAVIORS  Try to distract yourself from urges to smoke. Talk to someone, go for a walk, or occupy your time with a task.   When you first try to quit, change your routine. Take a different route to work. Drink tea instead of coffee. Eat breakfast in a different place.   Do something to reduce your stress. Take a hot bath, exercise, or read a book.   Plan something enjoyable to do every day. Reward yourself for not smoking.   Explore  interactive web-based programs that specialize in helping you quit.  4. GET MEDICINE AND USE IT CORRECTLY Medicines can help you stop smoking and decrease the urge to smoke. Combining medicine with the above behavioral methods and support can quadruple your chances of successfully quitting smoking. The U.S. Food and Drug Administration (FDA) has approved 7 medicines to help you quit smoking. These medicines fall into 3 categories.  Nicotine replacement therapy (delivers nicotine to your body without the negative effects and risks of smoking):   Nicotine gum: Available over-the-counter.   Nicotine lozenges: Available over-the-counter.   Nicotine inhaler: Available by prescription.   Nicotine nasal spray: Available by prescription.   Nicotine skin patches (transdermal): Available by prescription and over-the-counter.   Antidepressant medicine (  helps people abstain from smoking, but how this works is unknown):   Bupropion sustained-release (SR) tablets: Available by prescription.   Nicotinic receptor partial agonist (simulates the effect of nicotine in your brain):   Varenicline tartrate tablets: Available by prescription.   Ask your caregiver for advice about which medicines to use and how to use them. Carefully read the information on the package.   Everyone who is trying to quit may benefit from using a medicine. If you are pregnant or trying to become pregnant, nursing an infant, you are under age 18, or you smoke fewer than 10 cigarettes per day, talk to your caregiver before taking any nicotine replacement medicines.   You should stop using a nicotine replacement product and call your caregiver if you experience nausea, dizziness, weakness, vomiting, fast or irregular heartbeat, mouth problems with the lozenge or gum, or redness or swelling of the skin around the patch that does not go away.   Do not use any other product containing nicotine while using a nicotine replacement  product.   Talk to your caregiver before using these products if you have diabetes, heart disease, asthma, stomach ulcers, you had a recent heart attack, you have high blood pressure that is not controlled with medicine, a history of irregular heartbeat, or you have been prescribed medicine to help you quit smoking.  5. BE PREPARED FOR RELAPSE OR DIFFICULT SITUATIONS  Most relapses occur within the first 3 months after quitting. Do not be discouraged if you start smoking again. Remember, most people try several times before they finally quit.   You may have symptoms of withdrawal because your body is used to nicotine. You may crave cigarettes, be irritable, feel very hungry, cough often, get headaches, or have difficulty concentrating.   The withdrawal symptoms are only temporary. They are strongest when you first quit, but they will go away within 10 to 14 days.  Here are some difficult situations to watch for:  Alcohol. Avoid drinking alcohol. Drinking lowers your chances of successfully quitting.   Caffeine. Try to reduce the amount of caffeine you consume. It also lowers your chances of successfully quitting.   Other smokers. Being around smoking can make you want to smoke. Avoid smokers.   Weight gain. Many smokers will gain weight when they quit, usually less than 10 pounds. Eat a healthy diet and stay active. Do not let weight gain distract you from your main goal, quitting smoking. Some medicines that help you quit smoking may also help delay weight gain. You can always lose the weight gained after you quit.   Bad mood or depression. There are a lot of ways to improve your mood other than smoking.  If you are having problems with any of these situations, talk to your caregiver. SPECIAL SITUATIONS AND CONDITIONS Studies suggest that everyone can quit smoking. Your situation or condition can give you a special reason to quit.  Pregnant women/new mothers: By quitting, you protect your  baby's health and your own.   Hospitalized patients: By quitting, you reduce health problems and help healing.   Heart attack patients: By quitting, you reduce your risk of a second heart attack.   Lung, head, and neck cancer patients: By quitting, you reduce your chance of a second cancer.   Parents of children and adolescents: By quitting, you protect your children from illnesses caused by secondhand smoke.  QUESTIONS TO THINK ABOUT Think about the following questions before you try to stop smoking. You may   want to talk about your answers with your caregiver.  Why do you want to quit?   If you tried to quit in the past, what helped and what did not?   What will be the most difficult situations for you after you quit? How will you plan to handle them?   Who can help you through the tough times? Your family? Friends? Caregiver?   What pleasures do you get from smoking? What ways can you still get pleasure if you quit?  Here are some questions to ask your caregiver:  How can you help me to be successful at quitting?   What medicine do you think would be best for me and how should I take it?   What should I do if I need more help?   What is smoking withdrawal like? How can I get information on withdrawal?  Quitting takes hard work and a lot of effort, but you can quit smoking.  

## 2013-04-28 NOTE — Progress Notes (Signed)
Latoya [name] [birth year] [id number]        36 y.o.  G1P1001 for annual exam.  Several issues noted below.  Past medical history,surgical history, medications, allergies, family history and social history were all reviewed and documented in the EPIC chart. ROS:  Was performed and pertinent positives and negatives are included in the history.  Exam: Kim assistant Filed Vitals:   04/28/13 1357  BP: 130/80  Height: 5\' 6"  (1.676 m)  Weight: 176 lb (79.833 kg)   General appearance  Normal Skin grossly normal Head/Neck normal with no cervical or supraclavicular adenopathy thyroid normal Lungs  clear Cardiac RR, without RMG Abdominal  soft, nontender, without masses, organomegaly or hernia Breasts  examined lying and sitting without masses, retractions, discharge or axillary adenopathy. Pelvic  Ext/BUS/vagina  normal   Adnexa  Without masses or tenderness    Anus and perineum  normal   Rectovaginal  normal sphincter tone without palpated masses or tenderness.    Assessment/Plan:  37 y.o. G87P1001 female for annual exam.   1. Status post LAVH ultimate BSO on Estrace 1 mg daily doing well and wants to continue. We have discussed risks of hormone replacement to include stroke heart attack DVT possible breast cancer. She understands accepts and wants to continue. I refilled her Estrace 1 mg x1 year. 2. Urgency symptoms occasionally. No urinary incontinence. Will check UA. Behavior modification techniques reviewed. Possible medication discussed. We'll followup wants to consider medication. 3. Pap smear 2011. No Pap smear done today. History of hysterectomy for benign indications. Options to stop screaming altogether less frequent screening intervals reviewed. She is comfortable with no screening at present. We'll readdress on an annual basis. 4. Mammogram never. Screening recommendations between 35 and 40 discussed. She has no strong family history and prefers to wait closer to 40. SBE monthly  review. 5. Stop smoking reviewed and encouraged. 6. Health maintenance. Had lab work to include CBC comp and his metabolic panel normal in July for surgery. Lipid profile last year was normal. No blood work ordered today. Followup in one year, sooner as needed.    Dara Lords MD, 2:21 PM 04/28/2013

## 2013-04-29 LAB — URINALYSIS W MICROSCOPIC + REFLEX CULTURE
Bilirubin Urine: NEGATIVE
Glucose, UA: NEGATIVE mg/dL
Hgb urine dipstick: NEGATIVE
Protein, ur: NEGATIVE mg/dL

## 2013-04-30 LAB — URINE CULTURE: Colony Count: >=100000

## 2013-05-03 ENCOUNTER — Other Ambulatory Visit: Payer: Self-pay | Admitting: Gynecology

## 2013-05-03 MED ORDER — CIPROFLOXACIN HCL 250 MG PO TABS: 250.0000 mg | ORAL_TABLET | Freq: Two times a day (BID) | ORAL | Status: DC

## 2013-05-03 MED ORDER — FLUCONAZOLE 150 MG PO TABS: 150.0000 mg | ORAL_TABLET | Freq: Once | ORAL | Status: DC

## 2014-01-05 IMAGING — CT CT L SPINE W/O CM
3 of 4 series · 11 of 20 positions shown, 13 images · non-contrast
Comparison: None.

CLINICAL DATA: Low back pain.  Numbness in the right leg.  Post
discogram examination.

CT LUMBAR SPINE WITHOUT CONTRAST
TECHNIQUE: Multidetector CT imaging of the lumbar spine was
performed without intravenous contrast administration. Multiplanar
CT image reconstructions were also generated.

[Series 2: l spine bone · axial · 0.27mm/px · z∈[-48,+102]mm · 5 of 92 slices shown, 7 images]
[im 16/92  soft-tissue]
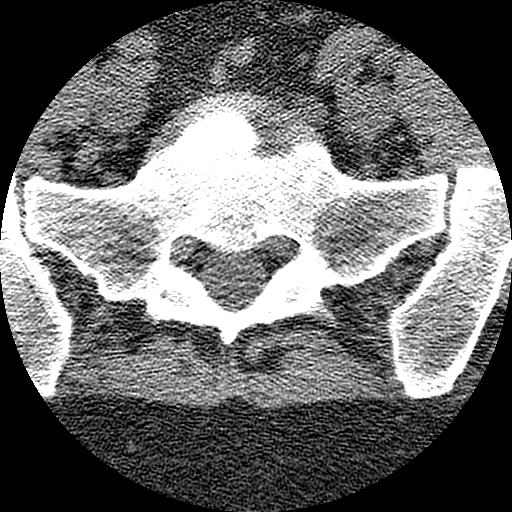
[im 16/92  bone]
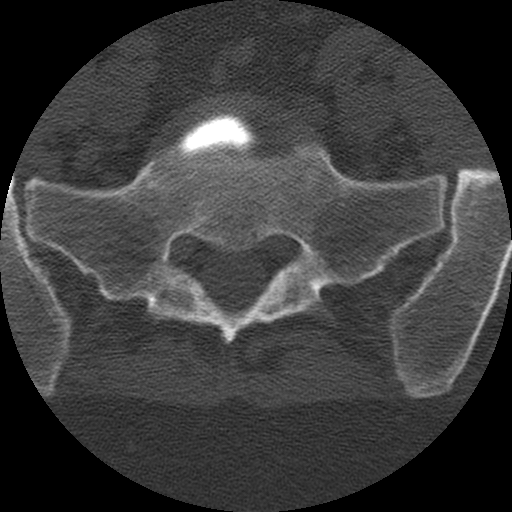
[im 31/92  bone]
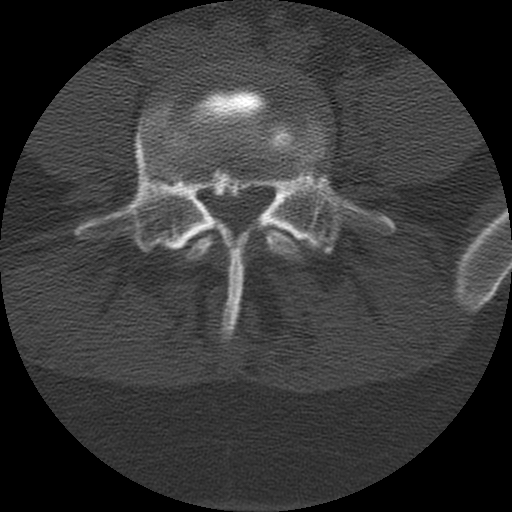
[im 46/92  bone]
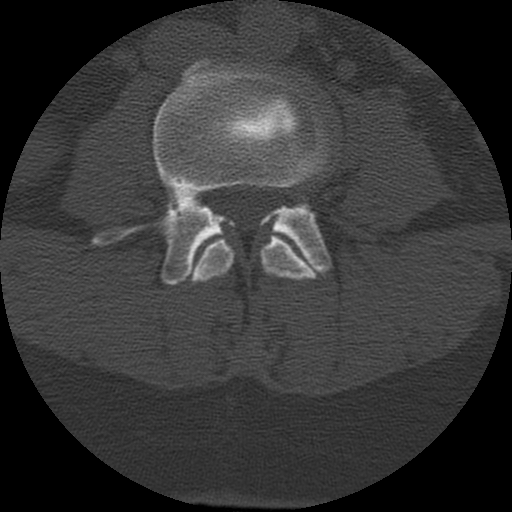
[im 61/92  bone]
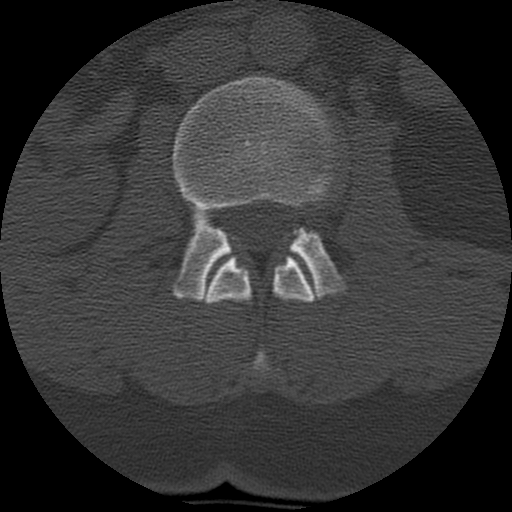
[im 76/92  soft-tissue]
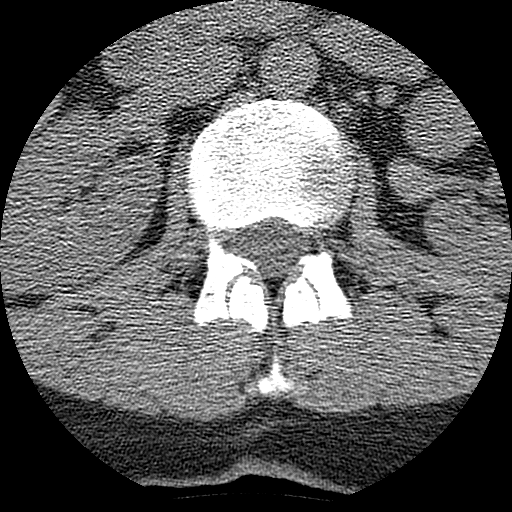
[im 76/92  bone]
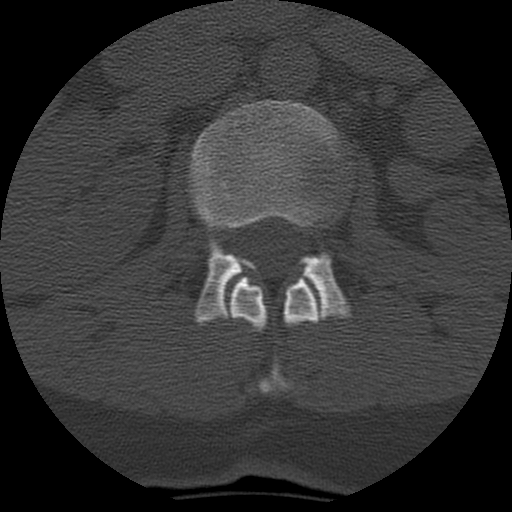

[Series 3: l spine soft · axial · 0.27mm/px · z∈[-48,+102]mm · 5 of 92 slices shown]
[im 16/92  soft-tissue]
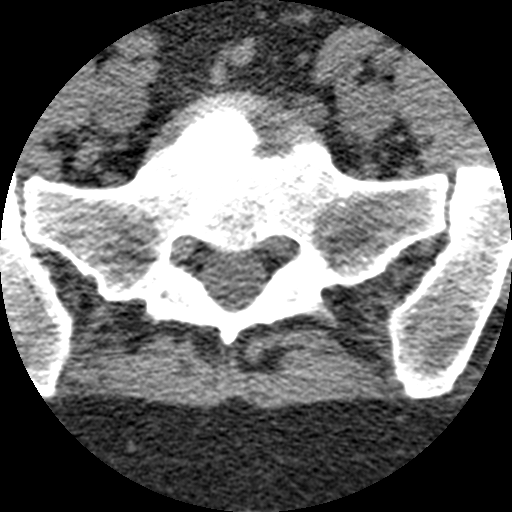
[im 31/92  soft-tissue]
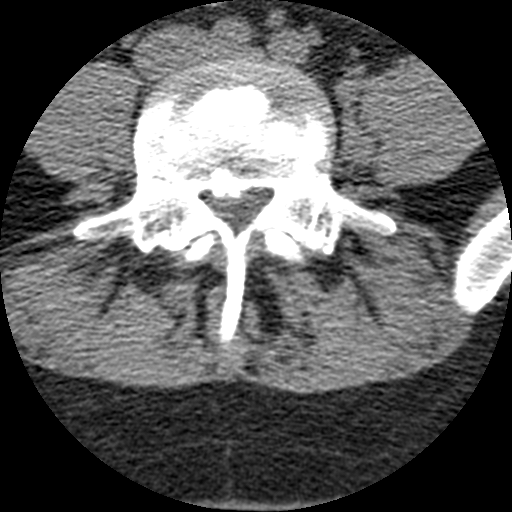
[im 46/92  soft-tissue]
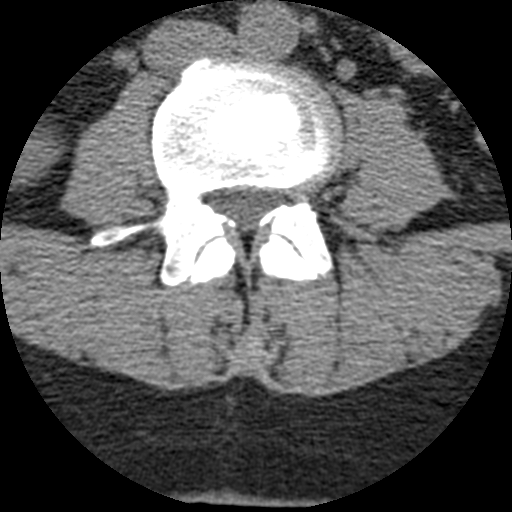
[im 61/92  soft-tissue]
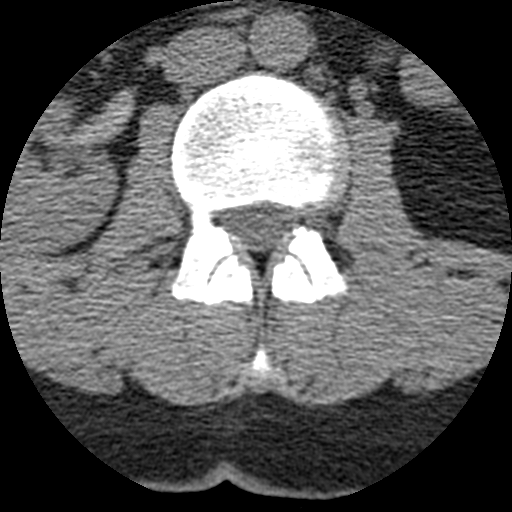
[im 76/92  soft-tissue]
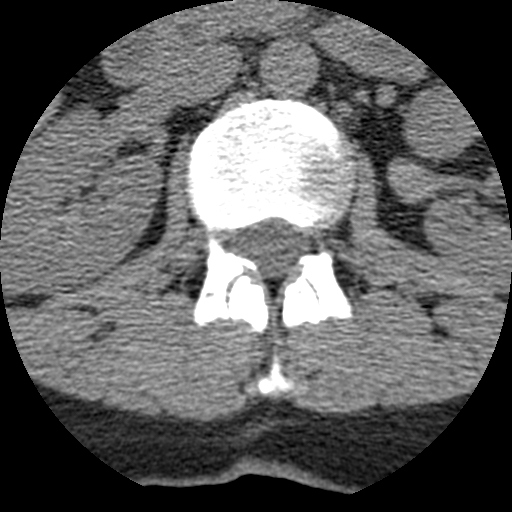

[Series 400: sag · coronal · 0.46mm/px · 1 of 50 slices shown]
[im 25/50  bone]
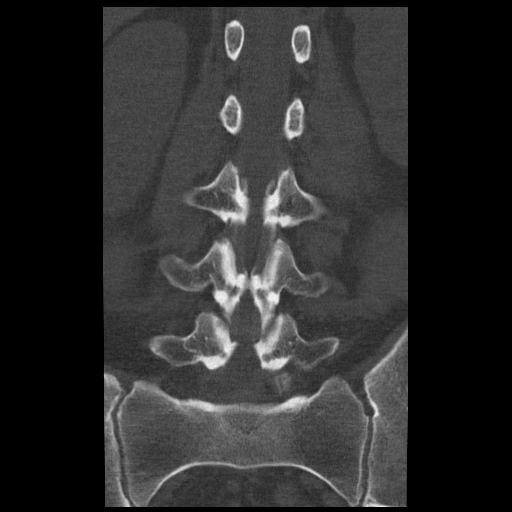

[11 of 20 positions shown; findings below may reference images not displayed]

FINDINGS: Discography was performed at outside location.

L1-2 and L2-3 were not studied but appear normal.

L3-4:  Normal nuclear pattern.  Canal and foramina widely patent.

L4-5:  There is annular tearing from 5 o'clock to 7 o'clock with
extrusion of injectate into the ventral epidural space.  Protruding
disc material compresses the thecal sac.  Narrowing of the lateral
recesses could cause neural compression.

L5-S1:  Injection is primarily annular. Communication with the
central nuclear region not demonstrated.  The disc bulges mildly.

There is mild osteoarthritis of both sacroiliac joints.
IMPRESSION: L3-4:  Normal pattern.

L4-5:  Annular tearing from 5 o'clock to 7 o'clock with extrusion
in the midline.  Stenosis of the canal/lateral recesses.

L5-S1:  Annular / peripheral nuclear injection.  Central nucleus
not opacified.  Bulging of the disc.

## 2014-04-29 ENCOUNTER — Ambulatory Visit (INDEPENDENT_AMBULATORY_CARE_PROVIDER_SITE_OTHER): Payer: BC Managed Care – PPO | Admitting: Gynecology

## 2014-04-29 ENCOUNTER — Encounter: Payer: Self-pay | Admitting: Gynecology

## 2014-04-29 ENCOUNTER — Other Ambulatory Visit (HOSPITAL_COMMUNITY)
Admission: RE | Admit: 2014-04-29 | Discharge: 2014-04-29 | Disposition: A | Discharge status: Home / Self Care | Payer: BC Managed Care – PPO | Source: Ambulatory Visit | Attending: Gynecology | Admitting: Gynecology

## 2014-04-29 VITALS — BP 124/76 | Ht 67.0 in | Wt 175.0 lb

## 2014-04-29 DIAGNOSIS — Z79890 Hormone replacement therapy (postmenopausal): Secondary | ICD-10-CM

## 2014-04-29 DIAGNOSIS — Z01419 Encounter for gynecological examination (general) (routine) without abnormal findings: Secondary | ICD-10-CM

## 2014-04-29 LAB — LIPID PANEL
Cholesterol: 143 mg/dL (ref 0–200)
HDL: 71 mg/dL (ref >39)
LDL CALC: 40 mg/dL (ref 0–99)
TRIGLYCERIDES: 160 mg/dL — AB (ref <150)
Total CHOL/HDL Ratio: 2 Ratio
VLDL: 32 mg/dL (ref 0–40)

## 2014-04-29 LAB — COMPREHENSIVE METABOLIC PANEL
ALBUMIN: 4.2 g/dL (ref 3.5–5.2)
ALK PHOS: 58 U/L (ref 39–117)
ALT: 20 U/L (ref 0–35)
AST: 23 U/L (ref 0–37)
BUN: 11 mg/dL (ref 6–23)
CALCIUM: 8.8 mg/dL (ref 8.4–10.5)
CHLORIDE: 107 meq/L (ref 96–112)
CO2: 26 mEq/L (ref 19–32)
Creat: 0.7 mg/dL (ref 0.50–1.10)
GLUCOSE: 84 mg/dL (ref 70–99)
POTASSIUM: 4.4 meq/L (ref 3.5–5.3)
SODIUM: 142 meq/L (ref 135–145)
TOTAL PROTEIN: 6.4 g/dL (ref 6.0–8.3)
Total Bilirubin: 0.4 mg/dL (ref 0.2–1.2)

## 2014-04-29 LAB — CBC WITH DIFFERENTIAL/PLATELET
BASOS PCT: 1 % (ref 0–1)
Basophils Absolute: 0 10*3/uL (ref 0.0–0.1)
Eosinophils Absolute: 0.3 10*3/uL (ref 0.0–0.7)
Eosinophils Relative: 6 % — ABNORMAL HIGH (ref 0–5)
HEMATOCRIT: 37.6 % (ref 36.0–46.0)
HEMOGLOBIN: 12.9 g/dL (ref 12.0–15.0)
LYMPHS ABS: 1.2 10*3/uL (ref 0.7–4.0)
LYMPHS PCT: 26 % (ref 12–46)
MCH: 31.9 pg (ref 26.0–34.0)
MCHC: 34.3 g/dL (ref 30.0–36.0)
MCV: 92.8 fL (ref 78.0–100.0)
MONO ABS: 0.4 10*3/uL (ref 0.1–1.0)
MONOS PCT: 9 % (ref 3–12)
NEUTROS ABS: 2.7 10*3/uL (ref 1.7–7.7)
NEUTROS PCT: 58 % (ref 43–77)
Platelets: 221 10*3/uL (ref 150–400)
RBC: 4.05 MIL/uL (ref 3.87–5.11)
RDW: 15.6 % — ABNORMAL HIGH (ref 11.5–15.5)
WBC: 4.6 10*3/uL (ref 4.0–10.5)

## 2014-04-29 MED ORDER — ESTRADIOL 1 MG PO TABS: 1.0000 mg | ORAL_TABLET | Freq: Every day | ORAL | Status: DC

## 2014-04-29 NOTE — Patient Instructions (Signed)
Call to Schedule your mammogram  Facilities in [place]: 1)  [facility], [place] Phone: [phone] 2)  [facility]. [address] [place] Phone: [phone] 3)  Dr. Isaiah Blakes at [facility] [address] Phone: [phone]     Mammogram A mammogram is an X-ray test to find changes in a woman's breast. You should get a mammogram if:  You are 40 years of age or older  You have risk factors.   Your doctor recommends that you have one.  BEFORE THE TEST  Do not schedule the test the week before your period, especially if your breasts are sore during this time.  On the day of your mammogram:  Wash your breasts and armpits well. After washing, do not put on any deodorant or talcum powder on until after your test.   Eat and drink as you usually do.   Take your medicines as usual.   If you are diabetic and take insulin, make sure you:   Eat before coming for your test.   Take your insulin as usual.   If you cannot keep your appointment, call before the appointment to cancel. Schedule another appointment.  TEST  You will need to undress from the waist up. You will put on a hospital gown.   Your breast will be put on the mammogram machine, and it will press firmly on your breast with a piece of plastic called a compression paddle. This will make your breast flatter so that the machine can X-ray all parts of your breast.   Both breasts will be X-rayed. Each breast will be X-rayed from above and from the side. An X-ray might need to be taken again if the picture is not good enough.   The mammogram will last about 15 to 30 minutes.  AFTER THE TEST Finding out the results of your test Ask when your test results will be ready. Make sure you get your test results.  Document Released: 03/14/2009 Document Revised: 12/05/2011 Document Reviewed: 03/14/2009 [facility] Patient  Information 2012 [place].

## 2014-04-29 NOTE — Progress Notes (Signed)
Latoya [name] August 10, 1976 [id number]        38 y.o.  G1P1001 for annual exam.  Several issues that are below.  Past medical history,surgical history, problem list, medications, allergies, family history and social history were all reviewed and documented as reviewed in the EPIC chart.  ROS:  12 system ROS performed with pertinent positives and negatives included in the history, assessment and plan.  Included Systems: General, HEENT, Neck, Cardiovascular, Pulmonary, Gastrointestinal, Genitourinary, Musculoskeletal, Dermatologic, Endocrine, Hematological, Neurologic, Psychiatric Additional significant findings : Left axillary pain   Exam: Kim assistant Filed Vitals:   04/29/14 0828  BP: 124/76  Height: 5' 7"  (1.702 m)  Weight: 175 lb (79.379 kg)   General appearance:  Normal affect, orientation and appearance. Skin: Grossly normal HEENT: Without gross lesions.  No cervical or supraclavicular adenopathy. Thyroid normal.  Lungs:  Clear without wheezing, rales or rhonchi Cardiac: RR, without RMG Abdominal:  Soft, nontender, without masses, guarding, rebound, organomegaly or hernia Breasts:  Examined lying and sitting without masses, retractions, discharge or axillary adenopathy. Pelvic:  Ext/BUS/vagina normal. Pap of cuff done  Adnexa  Without masses or tenderness    Anus and perineum  Normal   Rectovaginal  Normal sphincter tone without palpated masses or tenderness.    Assessment/Plan:  38 y.o. G77P1001 female for annual exam.   1. ERT status post LAVH 2007 subsequent BSO on Estrace 1 mg daily doing well. I again reviewed the risks of ERT to include thrombosis of a stroke heart attack DVT and breast cancer issues. Advantages to 1 as for his cardiovascular and bone health also discussed. Patient wants to continue it I refilled her x1 year. 2. Left axillary pain x2 days. No identifiable causes. Patient said it feels like a pulled muscle. Along pectoralis edge anterior axillary line. No  abnormalities on exam. Clearly outside the breast tissue. Recommend heat to the area. It persists she'll followup with me. It resolves and will monitor. 36. Mother recently diagnosed with metastatic lung cancer. Again strongly discussed the risks of smoking with the patient and she is going to plan to quit this year. 4. Pap smear 2011. No history of significant abnormal Pap smears. Pap of cuff done today. Options to stop screening altogether or less frequent screening intervals reviewed. Will readdress on an annual basis. 5. Mammography. Patient plans baseline mammogram this coming year and we'll schedule this. SBE monthly reviewed. 6. Health maintenance. Baseline CBC comprehensive metabolic panel lipid profile urinalysis ordered. Followup one year, sooner as needed.  Note: This document was prepared with digital dictation and possible smart phrase technology. Any transcriptional errors that result from this process are unintentional.   Anastasio Auerbach MD, 8:50 AM 04/29/2014

## 2014-04-29 NOTE — Addendum Note (Signed)
Addended by: Nelva Nay on: 04/29/2014 09:03 AM   Modules accepted: Orders

## 2014-04-30 LAB — URINALYSIS W MICROSCOPIC + REFLEX CULTURE
CASTS: NONE SEEN
CRYSTALS: NONE SEEN
GLUCOSE, UA: NEGATIVE mg/dL
HGB URINE DIPSTICK: NEGATIVE
Ketones, ur: NEGATIVE mg/dL
LEUKOCYTES UA: NEGATIVE
NITRITE: NEGATIVE
PH: 5.5 (ref 5.0–8.0)
Protein, ur: NEGATIVE mg/dL
SPECIFIC GRAVITY, URINE: 1.024 (ref 1.005–1.030)
Urobilinogen, UA: 0.2 mg/dL (ref 0.0–1.0)

## 2014-05-03 ENCOUNTER — Other Ambulatory Visit: Payer: Self-pay | Admitting: Gynecology

## 2014-05-03 LAB — URINE CULTURE: Colony Count: >=100000

## 2014-05-03 MED ORDER — SULFAMETHOXAZOLE-TMP DS 800-160 MG PO TABS: 1.0000 | ORAL_TABLET | Freq: Two times a day (BID) | ORAL | Status: DC

## 2014-10-05 ENCOUNTER — Telehealth: Payer: Self-pay | Admitting: *Deleted

## 2014-10-05 NOTE — Telephone Encounter (Signed)
Pt called requesting name of PCP # given to [facility], pt will call to schedule.

## 2014-10-11 ENCOUNTER — Encounter: Payer: Self-pay | Admitting: Family

## 2014-10-11 ENCOUNTER — Ambulatory Visit (INDEPENDENT_AMBULATORY_CARE_PROVIDER_SITE_OTHER): Payer: BC Managed Care – PPO | Admitting: Family

## 2014-10-11 ENCOUNTER — Other Ambulatory Visit (INDEPENDENT_AMBULATORY_CARE_PROVIDER_SITE_OTHER): Payer: BC Managed Care – PPO

## 2014-10-11 VITALS — BP 142/90 | HR 78 | Temp 98.2°F | Resp 18 | Ht 67.0 in | Wt 176.1 lb

## 2014-10-11 DIAGNOSIS — K219 Gastro-esophageal reflux disease without esophagitis: Secondary | ICD-10-CM

## 2014-10-11 DIAGNOSIS — R202 Paresthesia of skin: Secondary | ICD-10-CM

## 2014-10-11 DIAGNOSIS — L989 Disorder of the skin and subcutaneous tissue, unspecified: Secondary | ICD-10-CM

## 2014-10-11 DIAGNOSIS — R200 Anesthesia of skin: Secondary | ICD-10-CM

## 2014-10-11 DIAGNOSIS — G43809 Other migraine, not intractable, without status migrainosus: Secondary | ICD-10-CM

## 2014-10-11 DIAGNOSIS — R238 Other skin changes: Secondary | ICD-10-CM

## 2014-10-11 DIAGNOSIS — G43909 Migraine, unspecified, not intractable, without status migrainosus: Secondary | ICD-10-CM | POA: Insufficient documentation

## 2014-10-11 LAB — BASIC METABOLIC PANEL
BUN: 15 mg/dL (ref 6–23)
CHLORIDE: 102 meq/L (ref 96–112)
CO2: 26 meq/L (ref 19–32)
CREATININE: 0.7 mg/dL (ref 0.4–1.2)
Calcium: 9.2 mg/dL (ref 8.4–10.5)
GFR: 93.72 mL/min (ref >60.00)
GLUCOSE: 86 mg/dL (ref 70–99)
POTASSIUM: 4.2 meq/L (ref 3.5–5.1)
Sodium: 135 mEq/L (ref 135–145)

## 2014-10-11 LAB — CBC
HEMATOCRIT: 38.8 % (ref 36.0–46.0)
HEMOGLOBIN: 12.7 g/dL (ref 12.0–15.0)
MCHC: 32.7 g/dL (ref 30.0–36.0)
MCV: 97.5 fl (ref 78.0–100.0)
PLATELETS: 250 10*3/uL (ref 150.0–400.0)
RBC: 3.98 Mil/uL (ref 3.87–5.11)
RDW: 15.3 % (ref 11.5–15.5)
WBC: 9 10*3/uL (ref 4.0–10.5)

## 2014-10-11 LAB — B12 AND FOLATE PANEL
Folate: 5.3 ng/mL — ABNORMAL LOW (ref >5.9)
Vitamin B-12: 178 pg/mL — ABNORMAL LOW (ref 211–911)

## 2014-10-11 MED ORDER — ZOLMITRIPTAN 5 MG PO TABS: 5.0000 mg | ORAL_TABLET | ORAL | Status: DC | PRN

## 2014-10-11 NOTE — Assessment & Plan Note (Signed)
Obtain BMET, CBC and B12/Folate to r/o megaloblastic anemia. Cannot rule out fibromyalgia as a potential cause of the soreness and hypersensitivity.

## 2014-10-11 NOTE — Progress Notes (Signed)
Pre visit review using our clinic review tool, if applicable. No additional management support is needed unless otherwise documented below in the visit note. 

## 2014-10-11 NOTE — Assessment & Plan Note (Signed)
Currently stable with Zomig. Rx refilled per patient request.

## 2014-10-11 NOTE — Assessment & Plan Note (Signed)
Currently stable with omeprazole. Continue current regimen.

## 2014-10-11 NOTE — Progress Notes (Signed)
   Subjective:    Patient ID: Latoya [name], female    DOB: 1976/08/30, 38 y.o.   MRN: [id number]  HPI:  Latoya [name] is a 38 y.o. female who presents today to establish care. Will be followed also by [facility]. [facility].   1) GERD - currently maintained on omeprazole. Indicates the condition is stable.   2) Skin sensitivity - Has noticed a tightness and numbness and tingling over the last 6 months getting more frequent from knees down into feet. Describes tightness in knees, feels like they are swollen but they are not. Skin is sensitive and feels bruised and no bruise is present. Has not started or stopped any medications, denies diet change. Migraines have slowed after having hysterectomy. Usually worst in the morning. Denies low back or trauma. Sensation is also noted in her upper extremity at times.   3) Migraines - maintained on Zomig. Pt requests refill of medication.   Allergies  Allergen Reactions  . Codeine Itching   Current Outpatient Prescriptions on File Prior to Visit  Medication Sig Dispense Refill  . estradiol (ESTRACE) 1 MG tablet Take 1 tablet (1 mg total) by mouth daily.  30 tablet  12  . Fluticasone Propionate (FLONASE NA) Place into the nose.      Marland Kitchen omeprazole (PRILOSEC) 20 MG capsule Take 20 mg by mouth daily.       No current facility-administered medications on file prior to visit.   Past Medical History  Diagnosis Date  . Endometriosis 2000  . PONV (postoperative nausea and vomiting)   . PAT (paroxysmal atrial tachycardia)     occ if stressed,no tests in 17 yrs  . UTI (lower urinary tract infection)     "more when I was younger; not bad since hysterectomy"  . Bleeding gastric ulcer 11/2002    post gastric bypass  . Migraines     "used to have often; now maybe 1-2/yr" (07/15/12)  . Anemia     iron deficiency  . History of blood transfusion 11/2002    "for bleeding ulcer"  . GERD (gastroesophageal reflux disease)      Review of Systems      See HPI Objective:     BP 142/90  Pulse 78  Temp(Src) 98.2 F (36.8 C) (Oral)  Resp 18  Ht 5' 7"  (1.702 m)  Wt 176 lb 1.9 oz (79.888 kg)  BMI 27.58 kg/m2  SpO2 93%  LMP 12/30/2005 Nursing note and vital signs reviewed.  Physical Exam  Constitutional: She is oriented to person, place, and time. She appears well-developed and well-nourished. No distress.  Cardiovascular: Normal rate, regular rhythm and normal heart sounds.   Pulmonary/Chest: Effort normal and breath sounds normal.  Musculoskeletal:  No obvious deformity or discoloration of bilateral lower extremities. Non-pitting edema is noted bilaterally. Full ROM and strength in bilateral knees and ankles. Pulses are 1+ in the distal extremities.   Neurological: She is alert and oriented to person, place, and time.  Skin: Skin is warm and dry.  Psychiatric: She has a normal mood and affect. Her behavior is normal. Judgment and thought content normal.        Assessment & Plan:

## 2014-10-11 NOTE — Patient Instructions (Addendum)
Thank you for choosing [facility].  Summary/Instructions:   Please keep track of your pain symtpoms. May use Aleve for pain relief.  Stop by the lab for blood work.   We are going to check your blood count, blood cell size  Please make an appointment at your convenience for a physical and follow up.   Thank you for enrolling in [facility]. Please follow the instructions below to securely access your online medical record. MyChart allows you to send messages to your doctor, view your test results, renew your prescriptions, schedule appointments, and more.  How Do I Sign Up? 1. In your Internet browser, go to [email or web address]. 2. Click on the New  User? link in the Sign In box.  3. Enter your MyChart Access Code exactly as it appears below. You will not need to use this code after you have completed the sign-up process. If you do not sign up before the expiration date, you must request a new code. MyChart Access Code: [email or web address] Expires: 12/10/2014  5:07 PM  4. Enter the last four digits of your Social Security Number (xxxx) and Date of Birth (mm/dd/yyyy) as indicated and click Next. You will be taken to the next sign-up page. 5. Create a MyChart ID. This will be your MyChart login ID and cannot be changed, so think of one that is secure and easy to remember. 6. Create a MyChart password. You can change your password at any time. 7. Enter your Password Reset Question and Answer and click Next. This can be used at a later time if you forget your password.  8. Select your communication preference, and if applicable enter your e-mail address. You will receive e-mail notification when new information is available in MyChart by choosing to receive e-mail notifications and filling in your e-mail. 9. Click Sign In. You can now view your medical record.   Additional Information If you have questions, you can email [email or web address] or call  [phone] to talk to our [facility] staff. Remember, MyChart is NOT to be used for urgent needs. For medical emergencies, dial 911.

## 2014-10-12 ENCOUNTER — Telehealth: Payer: Self-pay | Admitting: Family

## 2014-10-12 ENCOUNTER — Encounter: Payer: Self-pay | Admitting: Family

## 2014-10-12 NOTE — Telephone Encounter (Signed)
Called pt to let her know that her vitamin B12 and folate levels were low. I advised her to take 1,000 mcg of B12 daily and 400 mcg or folate daily. Pt stated she was driving and asked me to e-mail the information to her. She is not active on my chart so I am going to mail the results to the given address.

## 2014-10-12 NOTE — Telephone Encounter (Signed)
Please call the patient to inform her that her Vitamin B12 and folate levels are low. I would like her to take 1,000 micrograms of B12 daily and 400 micrograms of folate daily. To get the Vitamin B12, she will need to get a separate supplement. I usually recommend Nature Made. The folic acid can be covered with either a separate supplement or a women's multivitamin. The best sources of folate are green leafy vegetables. The best source of B12 is meat and fish.

## 2014-10-31 ENCOUNTER — Encounter: Payer: Self-pay | Admitting: Family

## 2014-11-15 ENCOUNTER — Ambulatory Visit (INDEPENDENT_AMBULATORY_CARE_PROVIDER_SITE_OTHER): Payer: BC Managed Care – PPO | Admitting: Family

## 2014-11-15 ENCOUNTER — Encounter: Payer: Self-pay | Admitting: Family

## 2014-11-15 ENCOUNTER — Other Ambulatory Visit (INDEPENDENT_AMBULATORY_CARE_PROVIDER_SITE_OTHER): Payer: BC Managed Care – PPO

## 2014-11-15 VITALS — BP 138/82 | HR 79 | Temp 98.3°F | Resp 18 | Ht 67.0 in | Wt 181.6 lb

## 2014-11-15 DIAGNOSIS — Z23 Encounter for immunization: Secondary | ICD-10-CM

## 2014-11-15 DIAGNOSIS — G43809 Other migraine, not intractable, without status migrainosus: Secondary | ICD-10-CM

## 2014-11-15 DIAGNOSIS — Z0000 Encounter for general adult medical examination without abnormal findings: Secondary | ICD-10-CM

## 2014-11-15 DIAGNOSIS — K219 Gastro-esophageal reflux disease without esophagitis: Secondary | ICD-10-CM

## 2014-11-15 DIAGNOSIS — R790 Abnormal level of blood mineral: Secondary | ICD-10-CM

## 2014-11-15 LAB — LIPID PANEL
Cholesterol: 181 mg/dL (ref 0–200)
HDL: 92.1 mg/dL (ref >39.00)
NonHDL: 88.9
Total CHOL/HDL Ratio: 2
Triglycerides: 217 mg/dL — ABNORMAL HIGH (ref 0.0–149.0)
VLDL: 43.4 mg/dL — ABNORMAL HIGH (ref 0.0–40.0)

## 2014-11-15 LAB — B12 AND FOLATE PANEL
Folate: >24.5 ng/mL (ref >5.9)
Vitamin B-12: 311 pg/mL (ref 211–911)

## 2014-11-15 LAB — BASIC METABOLIC PANEL WITH GFR
BUN: 13 mg/dL (ref 6–23)
CO2: 21 meq/L (ref 19–32)
Calcium: 9.4 mg/dL (ref 8.4–10.5)
Chloride: 119 meq/L — ABNORMAL HIGH (ref 96–112)
Creatinine, Ser: 0.8 mg/dL (ref 0.4–1.2)
GFR: 84.28 mL/min (ref >60.00)
Glucose, Bld: 93 mg/dL (ref 70–99)
Potassium: 4.8 meq/L (ref 3.5–5.1)
Sodium: 155 meq/L — ABNORMAL HIGH (ref 135–145)

## 2014-11-15 LAB — CBC
HCT: 38 % (ref 36.0–46.0)
Hemoglobin: 12.6 g/dL (ref 12.0–15.0)
MCHC: 33.3 g/dL (ref 30.0–36.0)
MCV: 96.7 fl (ref 78.0–100.0)
Platelets: 213 K/uL (ref 150.0–400.0)
RBC: 3.93 Mil/uL (ref 3.87–5.11)
RDW: 14.6 % (ref 11.5–15.5)
WBC: 4.9 K/uL (ref 4.0–10.5)

## 2014-11-15 LAB — TSH: TSH: 2.55 u[IU]/mL (ref 0.35–4.50)

## 2014-11-15 LAB — LDL CHOLESTEROL, DIRECT: Direct LDL: 67.3 mg/dL

## 2014-11-15 NOTE — Assessment & Plan Note (Addendum)
1) Anticipatory Guidance: Discussed importance of wearing a seatbelt while driving and not texting while driving; changing batteries in smoke detector at least once annually; wearing suntan lotion when outside; eating a balanced and moderate diet; getting physical activity at least 30 minutes per day.  2) Immunizations / Screenings / Labs:  TDap given today. All other immunizations and recommended screenings are up to date. Obtain TSH, Lipid profile, CBC, BMET, B12/Folate.  Overall well exam. Has quit smoking for 8 weeks now. Continue with lifestyle changes and increase activity as tolerated. Feeling better with B12. Continue supplementation. Follow up in 1 year for prevention exam.

## 2014-11-15 NOTE — Progress Notes (Signed)
Pre visit review using our clinic review tool, if applicable. No additional management support is needed unless otherwise documented below in the visit note. 

## 2014-11-15 NOTE — Assessment & Plan Note (Signed)
Stable on Zomig. Continue current dose of Zomig as needed for headaches.

## 2014-11-15 NOTE — Progress Notes (Signed)
Subjective:    Patient ID: Latoya [name], female    DOB: 07/27/1976, 38 y.o.   MRN: [id number]  Chief Complaint  Patient presents with  . CPE    wants folic acid and V74 levels checked    HPI:  Latoya [name] is a 38 y.o. female who presents today for an annual wellness visit.  1) Health Maintenance -   Diet - low fat, high protein. Limits carbs Exercise - Walks about 30 minutes 4x per week.   Wt Readings from Last 3 Encounters:  11/15/14 181 lb 9.6 oz (82.373 kg)  10/11/14 176 lb 1.9 oz (79.888 kg)  04/29/14 175 lb (79.379 kg)   2) Preventative Exams / Immunizations:  Dental -- Up to date Vision -- To be scheduled  Health Maintenance  Topic Date Due  . TETANUS/TDAP  07/04/1993  . INFLUENZA VACCINE  07/30/2014  . PAP SMEAR  04/29/2017  Tetanus shot today  3) GERD - remains stably controlled with 20 mg omeprazole.  4) Migraines - remain well controlled with Zomig 5 mg.   Quit smoking 8 weeks ago.   Allergies  Allergen Reactions  . Codeine Itching   Past Medical History  Diagnosis Date  . Endometriosis 2000  . PONV (postoperative nausea and vomiting)   . PAT (paroxysmal atrial tachycardia)     occ if stressed,no tests in 17 yrs  . UTI (lower urinary tract infection)     "more when I was younger; not bad since hysterectomy"  . Bleeding gastric ulcer 11/2002    post gastric bypass  . Migraines     "used to have often; now maybe 1-2/yr" (07/15/12)  . Anemia     iron deficiency  . History of blood transfusion 11/2002    "for bleeding ulcer"  . GERD (gastroesophageal reflux disease)    Family History  Problem Relation Age of Onset  . Diabetes Mother   . Hypertension Mother   . Stroke Mother     mini  . Lung cancer Mother   . Hypertension Father   . Breast cancer Maternal Aunt 51   History   Social History  . Marital Status: Divorced    Spouse Name: N/A    Number of Children: 1  . Years of Education: 15   Occupational History  .  Accounting    Social History Main Topics  . Smoking status: Former Smoker -- 0.50 packs/day for 20 years    Types: Cigarettes  . Smokeless tobacco: Never Used  . Alcohol Use: 3.0 oz/week    5 Glasses of wine per week  . Drug Use: No  . Sexual Activity: Yes    Birth Control/ Protection: Surgical   Other Topics Concern  . Not on file   Social History Narrative   Born and raised in [place]. Lives with boyfriend of 6 years and 17 year old daughter.    Fun: Read a lot of mystery books   Exercise: Occasional walking about an hour a week    Diet: High protein, low fat. Control carbs - but a carbaholic   Denies religious beliefs that would effect healthcare.    Review of Systems  Constitutional: Denies fever, chills, fatigue, or significant weight gain/loss. HENT: Head: Denies headache or neck pain Ears: Denies changes in hearing, ringing in ears, earache, drainage Nose: Denies discharge, stuffiness, itching, nosebleed, sinus pain Throat: Denies sore throat, hoarseness, dry mouth, sores, thrush Eyes: Denies loss/changes in vision, pain, redness, blurry/double vision, flashing lights Cardiovascular:  Denies chest pain/discomfort, tightness, palpitations, shortness of breath with activity, difficulty lying down, swelling, sudden awakening with shortness of breath Respiratory: Denies shortness of breath, cough, sputum production, wheezing Gastrointestinal: Denies dysphasia, heartburn, change in appetite, nausea, change in bowel habits, rectal bleeding, constipation, diarrhea, yellow skin or eyes Genitourinary: Denies frequency, urgency, burning/pain, blood in urine, incontinence, change in urinary strength. Musculoskeletal: Denies muscle/joint pain, stiffness, back pain, redness or swelling of joints, trauma Skin: Denies rashes, lumps, itching, dryness, color changes, or hair/nail changes Neurological: Denies dizziness, fainting, seizures, weakness, numbness, tingling,  tremor Psychiatric - Denies nervousness, stress, depression or memory loss Endocrine: Denies heat or cold intolerance, sweating, frequent urination, excessive thirst, changes in appetite Hematologic: Denies ease of bruising or bleeding    Objective:    BP 138/82 mmHg  Pulse 79  Temp(Src) 98.3 F (36.8 C) (Oral)  Resp 18  Wt 181 lb 9.6 oz (82.373 kg)  SpO2 97%  LMP 12/30/2005 Nursing note and vital signs reviewed.  Physical Exam  Constitutional: She is oriented to person, place, and time. She appears well-developed and well-nourished.  HENT:  Head: Normocephalic.  Right Ear: Hearing, tympanic membrane, external ear and ear canal normal.  Left Ear: Hearing, tympanic membrane, external ear and ear canal normal.  Nose: Nose normal.  Mouth/Throat: Uvula is midline, oropharynx is clear and moist and mucous membranes are normal.  Eyes: Conjunctivae and EOM are normal. Pupils are equal, round, and reactive to light.  Neck: Neck supple. No JVD present. No tracheal deviation present. No thyromegaly present.  Cardiovascular: Normal rate, regular rhythm, normal heart sounds and intact distal pulses.   Pulmonary/Chest: Effort normal and breath sounds normal.  Abdominal: Soft. Bowel sounds are normal. She exhibits no distension and no mass. There is no tenderness. There is no rebound and no guarding.  Musculoskeletal: Normal range of motion. She exhibits no edema or tenderness.  Lymphadenopathy:    She has no cervical adenopathy.  Neurological: She is alert and oriented to person, place, and time. She has normal reflexes. No cranial nerve deficit. She exhibits normal muscle tone. Coordination normal.  Skin: Skin is warm and dry.  Psychiatric: She has a normal mood and affect. Her behavior is normal. Judgment and thought content normal.       Assessment & Plan:

## 2014-11-15 NOTE — Patient Instructions (Signed)
Thank you for choosing [facility].  Summary/Instructions:  Please stop by the lab on the basement level of the building for your blood work. Your results will be released to [place] (or called to you) after review, usually within 72hours after test completion. If any changes need to be made, you will be notified at that same time.  Keep up your exercise habits and congratulations on quitting smoking!!  Health Maintenance Adopting a healthy lifestyle and getting preventive care can go a long way to promote health and wellness. Talk with your health care provider about what schedule of regular examinations is right for you. This is a good chance for you to check in with your provider about disease prevention and staying healthy. In between checkups, there are plenty of things you can do on your own. Experts have done a lot of research about which lifestyle changes and preventive measures are most likely to keep you healthy. Ask your health care provider for more information. WEIGHT AND DIET  Eat a healthy diet  Be sure to include plenty of vegetables, fruits, low-fat dairy products, and lean protein.  Do not eat a lot of foods high in solid fats, added sugars, or salt.  Get regular exercise. This is one of the most important things you can do for your health.  Most adults should exercise for at least 150 minutes each week. The exercise should increase your heart rate and make you sweat (moderate-intensity exercise).  Most adults should also do strengthening exercises at least twice a week. This is in addition to the moderate-intensity exercise.  Maintain a healthy weight  Body mass index (BMI) is a measurement that can be used to identify possible weight problems. It estimates body fat based on height and weight. Your health care provider can help determine your BMI and help you achieve or maintain a healthy weight.  For females 12 years of age and older:   A BMI below 18.5 is  considered underweight.  A BMI of 18.5 to 24.9 is normal.  A BMI of 25 to 29.9 is considered overweight.  A BMI of 30 and above is considered obese.  Watch levels of cholesterol and blood lipids  You should start having your blood tested for lipids and cholesterol at 40 years of age, then have this test every 5 years.  You may need to have your cholesterol levels checked more often if:  Your lipid or cholesterol levels are high.  You are older than 40 years of age.  You are at high risk for heart disease.  CANCER SCREENING   Lung Cancer  Lung cancer screening is recommended for adults 29-80 years old who are at high risk for lung cancer because of a history of smoking.  A yearly low-dose CT scan of the lungs is recommended for people who:  Currently smoke.  Have quit within the past 15 years.  Have at least a 30-pack-year history of smoking. A pack year is smoking an average of one pack of cigarettes a day for 1 year.  Yearly screening should continue until it has been 15 years since you quit.  Yearly screening should stop if you develop a health problem that would prevent you from having lung cancer treatment.  Breast Cancer  Practice breast self-awareness. This means understanding how your breasts normally appear and feel.  It also means doing regular breast self-exams. Let your health care provider know about any changes, no matter how small.  If you are in  your 20s or 30s, you should have a clinical breast exam (CBE) by a health care provider every 1-3 years as part of a regular health exam.  If you are 15 or older, have a CBE every year. Also consider having a breast X-ray (mammogram) every year.  If you have a family history of breast cancer, talk to your health care provider about genetic screening.  If you are at high risk for breast cancer, talk to your health care provider about having an MRI and a mammogram every year.  Breast cancer gene (BRCA)  assessment is recommended for women who have family members with BRCA-related cancers. BRCA-related cancers include:  Breast.  Ovarian.  Tubal.  Peritoneal cancers.  Results of the assessment will determine the need for genetic counseling and BRCA1 and BRCA2 testing. Cervical Cancer Routine pelvic examinations to screen for cervical cancer are no longer recommended for nonpregnant women who are considered low risk for cancer of the pelvic organs (ovaries, uterus, and vagina) and who do not have symptoms. A pelvic examination may be necessary if you have symptoms including those associated with pelvic infections. Ask your health care provider if a screening pelvic exam is right for you.   The Pap test is the screening test for cervical cancer for women who are considered at risk.  If you had a hysterectomy for a problem that was not cancer or a condition that could lead to cancer, then you no longer need Pap tests.  If you are older than 65 years, and you have had normal Pap tests for the past 10 years, you no longer need to have Pap tests.  If you have had past treatment for cervical cancer or a condition that could lead to cancer, you need Pap tests and screening for cancer for at least 20 years after your treatment.  If you no longer get a Pap test, assess your risk factors if they change (such as having a new sexual partner). This can affect whether you should start being screened again.  Some women have medical problems that increase their chance of getting cervical cancer. If this is the case for you, your health care provider may recommend more frequent screening and Pap tests.  The human papillomavirus (HPV) test is another test that may be used for cervical cancer screening. The HPV test looks for the virus that can cause cell changes in the cervix. The cells collected during the Pap test can be tested for HPV.  The HPV test can be used to screen women 40 years of age and older.  Getting tested for HPV can extend the interval between normal Pap tests from three to five years.  An HPV test also should be used to screen women of any age who have unclear Pap test results.  After 40 years of age, women should have HPV testing as often as Pap tests.  Colorectal Cancer  This type of cancer can be detected and often prevented.  Routine colorectal cancer screening usually begins at 40 years of age and continues through 40 years of age.  Your health care provider may recommend screening at an earlier age if you have risk factors for colon cancer.  Your health care provider may also recommend using home test kits to check for hidden blood in the stool.  A small camera at the end of a tube can be used to examine your colon directly (sigmoidoscopy or colonoscopy). This is done to check for the earliest forms of  colorectal cancer.  Routine screening usually begins at age 30.  Direct examination of the colon should be repeated every 5-10 years through 40 years of age. However, you may need to be screened more often if early forms of precancerous polyps or small growths are found. Skin Cancer  Check your skin from head to toe regularly.  Tell your health care provider about any new moles or changes in moles, especially if there is a change in a mole's shape or color.  Also tell your health care provider if you have a mole that is larger than the size of a pencil eraser.  Always use sunscreen. Apply sunscreen liberally and repeatedly throughout the day.  Protect yourself by wearing long sleeves, pants, a wide-brimmed hat, and sunglasses whenever you are outside. HEART DISEASE, DIABETES, AND HIGH BLOOD PRESSURE   Have your blood pressure checked at least every 1-2 years. High blood pressure causes heart disease and increases the risk of stroke.  If you are between 30 years and 33 years old, ask your health care provider if you should take aspirin to prevent  strokes.  Have regular diabetes screenings. This involves taking a blood sample to check your fasting blood sugar level.  If you are at a normal weight and have a low risk for diabetes, have this test once every three years after 40 years of age.  If you are overweight and have a high risk for diabetes, consider being tested at a younger age or more often. PREVENTING INFECTION  Hepatitis B  If you have a higher risk for hepatitis B, you should be screened for this virus. You are considered at high risk for hepatitis B if:  You were born in a country where hepatitis B is common. Ask your health care provider which countries are considered high risk.  Your parents were born in a high-risk country, and you have not been immunized against hepatitis B (hepatitis B vaccine).  You have HIV or AIDS.  You use needles to inject street drugs.  You live with someone who has hepatitis B.  You have had sex with someone who has hepatitis B.  You get hemodialysis treatment.  You take certain medicines for conditions, including cancer, organ transplantation, and autoimmune conditions. Hepatitis C  Blood testing is recommended for:  Everyone born from [birth year] through 1967.  Anyone with known risk factors for hepatitis C. Sexually transmitted infections (STIs)  You should be screened for sexually transmitted infections (STIs) including gonorrhea and chlamydia if:  You are sexually active and are younger than 40 years of age.  You are older than 40 years of age and your health care provider tells you that you are at risk for this type of infection.  Your sexual activity has changed since you were last screened and you are at an increased risk for chlamydia or gonorrhea. Ask your health care provider if you are at risk.  If you do not have HIV, but are at risk, it may be recommended that you take a prescription medicine daily to prevent HIV infection. This is called pre-exposure prophylaxis  (PrEP). You are considered at risk if:  You are sexually active and do not regularly use condoms or know the HIV status of your partner(s).  You take drugs by injection.  You are sexually active with a partner who has HIV. Talk with your health care provider about whether you are at high risk of being infected with HIV. If you choose to begin PrEP,  you should first be tested for HIV. You should then be tested every 3 months for as long as you are taking PrEP.  PREGNANCY   If you are premenopausal and you may become pregnant, ask your health care provider about preconception counseling.  If you may become pregnant, take 400 to 800 micrograms (mcg) of folic acid every day.  If you want to prevent pregnancy, talk to your health care provider about birth control (contraception). OSTEOPOROSIS AND MENOPAUSE   Osteoporosis is a disease in which the bones lose minerals and strength with aging. This can result in serious bone fractures. Your risk for osteoporosis can be identified using a bone density scan.  If you are 27 years of age or older, or if you are at risk for osteoporosis and fractures, ask your health care provider if you should be screened.  Ask your health care provider whether you should take a calcium or vitamin D supplement to lower your risk for osteoporosis.  Menopause may have certain physical symptoms and risks.  Hormone replacement therapy may reduce some of these symptoms and risks. Talk to your health care provider about whether hormone replacement therapy is right for you.  HOME CARE INSTRUCTIONS   Schedule regular health, dental, and eye exams.  Stay current with your immunizations.   Do not use any tobacco products including cigarettes, chewing tobacco, or electronic cigarettes.  If you are pregnant, do not drink alcohol.  If you are breastfeeding, limit how much and how often you drink alcohol.  Limit alcohol intake to no more than 1 drink per day for  nonpregnant women. One drink equals 12 ounces of beer, 5 ounces of wine, or 1 ounces of hard liquor.  Do not use street drugs.  Do not share needles.  Ask your health care provider for help if you need support or information about quitting drugs.  Tell your health care provider if you often feel depressed.  Tell your health care provider if you have ever been abused or do not feel safe at home. Document Released: 07/01/2011 Document Revised: 05/02/2014 Document Reviewed: 11/17/2013 [facility] Patient Information 2015 [place], [place]. This information is not intended to replace advice given to you by your health care provider. Make sure you discuss any questions you have with your health care provider.

## 2014-11-15 NOTE — Assessment & Plan Note (Signed)
Stable. Continue current OTC use of omeprazole.

## 2015-03-02 ENCOUNTER — Telehealth: Payer: Self-pay | Admitting: *Deleted

## 2015-03-02 MED ORDER — ESTRADIOL 1 MG PO TABS: 1.0000 mg | ORAL_TABLET | Freq: Every day | ORAL | Status: DC

## 2015-03-02 NOTE — Telephone Encounter (Signed)
Pt called requesting a printed Rx for estradiol 1 mg. Pt annual due in May, scheduled 05/05/15. Rx will be mailed to pt address as requested.

## 2015-05-05 ENCOUNTER — Ambulatory Visit (INDEPENDENT_AMBULATORY_CARE_PROVIDER_SITE_OTHER): Payer: BLUE CROSS/BLUE SHIELD | Admitting: Gynecology

## 2015-05-05 ENCOUNTER — Encounter: Payer: Self-pay | Admitting: Gynecology

## 2015-05-05 VITALS — BP 122/74 | Ht 67.0 in | Wt 188.0 lb

## 2015-05-05 DIAGNOSIS — Z01419 Encounter for gynecological examination (general) (routine) without abnormal findings: Secondary | ICD-10-CM

## 2015-05-05 DIAGNOSIS — E8940 Asymptomatic postprocedural ovarian failure: Secondary | ICD-10-CM

## 2015-05-05 DIAGNOSIS — Z79890 Hormone replacement therapy (postmenopausal): Secondary | ICD-10-CM

## 2015-05-05 MED ORDER — ESTRADIOL 1 MG PO TABS: 1.0000 mg | ORAL_TABLET | Freq: Every day | ORAL | Status: DC

## 2015-05-05 NOTE — Progress Notes (Signed)
Latoya [name] August 23, 1976 [id number]        38 y.o.  G1P1001 for annual exam.  Doing well without complaints.  Past medical history,surgical history, problem list, medications, allergies, family history and social history were all reviewed and documented as reviewed in the EPIC chart.  ROS:  Performed with pertinent positives and negatives included in the history, assessment and plan.   Additional significant findings :  none   Exam: Latoya [name] Vitals:   05/05/15 0901  BP: 122/74  Height: 5' 7"  (1.702 m)  Weight: 188 lb (85.276 kg)   General appearance:  Normal affect, orientation and appearance. Skin: Grossly normal HEENT: Without gross lesions.  No cervical or supraclavicular adenopathy. Thyroid normal.  Lungs:  Clear without wheezing, rales or rhonchi Cardiac: RR, without RMG Abdominal:  Soft, nontender, without masses, guarding, rebound, organomegaly or hernia Breasts:  Examined lying and sitting without masses, retractions, discharge or axillary adenopathy. Pelvic:  Ext/BUS/vagina normal  Adnexa  Without masses or tenderness    Anus and perineum  Normal   Rectovaginal  Normal sphincter tone without palpated masses or tenderness.    Assessment/Plan:  39 y.o. G63P1001 female for annual exam.   1. History of LAVH 2007 with subsequent BSO for endometriosis on estradiol 1 mg daily. Patient doing well wants to continue. I again reviewed the risks to include increased risk of thrombosis such as stroke heart attack DVT and breast cancer issues. Possible advantages and younger patient to include cardiovascular protection and bone health also discussed. Refill 1 year provided. 2. Pap smear 2015. No Pap smear done today. No history of significant abnormal Pap smears. Options to stop screening as she is status post hysterectomy for benign indications versus continued screening discussed. Will readdress on an annual basis. 3. Mammography never. Recommended she schedule this year  and she plans to do so. SBE monthly reviewed. 4. Stop smoking again discussed. She had stopped for 6 months but restarted. She has set a stop date of May 18th. I encouraged her to do so. 5. Health maintenance. Patient recently had blood work this past fall by her primary physician. Follow up in one year, sooner as needed.     Anastasio Auerbach MD, 9:29 AM 05/05/2015

## 2015-05-05 NOTE — Patient Instructions (Signed)
You may obtain a copy of any labs that were done today by logging onto MyChart as outlined in the instructions provided with your AVS (after visit summary). The office will not call with normal lab results but certainly if there are any significant abnormalities then we will contact you.   Health Maintenance, Female A healthy lifestyle and preventative care can promote health and wellness.  Maintain regular health, dental, and eye exams.  Eat a healthy diet. Foods like vegetables, fruits, whole grains, low-fat dairy products, and lean protein foods contain the nutrients you need without too many calories. Decrease your intake of foods high in solid fats, added sugars, and salt. Get information about a proper diet from your caregiver, if necessary.  Regular physical exercise is one of the most important things you can do for your health. Most adults should get at least 150 minutes of moderate-intensity exercise (any activity that increases your heart rate and causes you to sweat) each week. In addition, most adults need muscle-strengthening exercises on 2 or more days a week.   Maintain a healthy weight. The body mass index (BMI) is a screening tool to identify possible weight problems. It provides an estimate of body fat based on height and weight. Your caregiver can help determine your BMI, and can help you achieve or maintain a healthy weight. For adults 20 years and older:  A BMI below 18.5 is considered underweight.  A BMI of 18.5 to 24.9 is normal.  A BMI of 25 to 29.9 is considered overweight.  A BMI of 30 and above is considered obese.  Maintain normal blood lipids and cholesterol by exercising and minimizing your intake of saturated fat. Eat a balanced diet with plenty of fruits and vegetables. Blood tests for lipids and cholesterol should begin at age 61 and be repeated every 5 years. If your lipid or cholesterol levels are high, you are over 50, or you are a high risk for heart  disease, you may need your cholesterol levels checked more frequently.Ongoing high lipid and cholesterol levels should be treated with medicines if diet and exercise are not effective.  If you smoke, find out from your caregiver how to quit. If you do not use tobacco, do not start.  Lung cancer screening is recommended for adults aged 33 80 years who are at high risk for developing lung cancer because of a history of smoking. Yearly low-dose computed tomography (CT) is recommended for people who have at least a 30-pack-year history of smoking and are a current smoker or have quit within the past 15 years. A pack year of smoking is smoking an average of 1 pack of cigarettes a day for 1 year (for example: 1 pack a day for 30 years or 2 packs a day for 15 years). Yearly screening should continue until the smoker has stopped smoking for at least 15 years. Yearly screening should also be stopped for people who develop a health problem that would prevent them from having lung cancer treatment.  If you are pregnant, do not drink alcohol. If you are breastfeeding, be very cautious about drinking alcohol. If you are not pregnant and choose to drink alcohol, do not exceed 1 drink per day. One drink is considered to be 12 ounces (355 mL) of beer, 5 ounces (148 mL) of wine, or 1.5 ounces (44 mL) of liquor.  Avoid use of street drugs. Do not share needles with anyone. Ask for help if you need support or instructions about stopping  the use of drugs.  High blood pressure causes heart disease and increases the risk of stroke. Blood pressure should be checked at least every 1 to 2 years. Ongoing high blood pressure should be treated with medicines, if weight loss and exercise are not effective.  If you are 59 to 41 years old, ask your caregiver if you should take aspirin to prevent strokes.  Diabetes screening involves taking a blood sample to check your fasting blood sugar level. This should be done once every 3  years, after age 91, if you are within normal weight and without risk factors for diabetes. Testing should be considered at a younger age or be carried out more frequently if you are overweight and have at least 1 risk factor for diabetes.  Breast cancer screening is essential preventative care for women. You should practice "breast self-awareness." This means understanding the normal appearance and feel of your breasts and may include breast self-examination. Any changes detected, no matter how small, should be reported to a caregiver. Women in their 66s and 30s should have a clinical breast exam (CBE) by a caregiver as part of a regular health exam every 1 to 3 years. After age 101, women should have a CBE every year. Starting at age 100, women should consider having a mammogram (breast X-ray) every year. Women who have a family history of breast cancer should talk to their caregiver about genetic screening. Women at a high risk of breast cancer should talk to their caregiver about having an MRI and a mammogram every year.  Breast cancer gene (BRCA)-related cancer risk assessment is recommended for women who have family members with BRCA-related cancers. BRCA-related cancers include breast, ovarian, tubal, and peritoneal cancers. Having family members with these cancers may be associated with an increased risk for harmful changes (mutations) in the breast cancer genes BRCA1 and BRCA2. Results of the assessment will determine the need for genetic counseling and BRCA1 and BRCA2 testing.  The Pap test is a screening test for cervical cancer. Women should have a Pap test starting at age 57. Between ages 25 and 35, Pap tests should be repeated every 2 years. Beginning at age 37, you should have a Pap test every 3 years as long as the past 3 Pap tests have been normal. If you had a hysterectomy for a problem that was not cancer or a condition that could lead to cancer, then you no longer need Pap tests. If you are  between ages 50 and 76, and you have had normal Pap tests going back 10 years, you no longer need Pap tests. If you have had past treatment for cervical cancer or a condition that could lead to cancer, you need Pap tests and screening for cancer for at least 20 years after your treatment. If Pap tests have been discontinued, risk factors (such as a new sexual partner) need to be reassessed to determine if screening should be resumed. Some women have medical problems that increase the chance of getting cervical cancer. In these cases, your caregiver may recommend more frequent screening and Pap tests.  The human papillomavirus (HPV) test is an additional test that may be used for cervical cancer screening. The HPV test looks for the virus that can cause the cell changes on the cervix. The cells collected during the Pap test can be tested for HPV. The HPV test could be used to screen women aged 44 years and older, and should be used in women of any age  who have unclear Pap test results. After the age of 55, women should have HPV testing at the same frequency as a Pap test.  Colorectal cancer can be detected and often prevented. Most routine colorectal cancer screening begins at the age of 44 and continues through age 20. However, your caregiver may recommend screening at an earlier age if you have risk factors for colon cancer. On a yearly basis, your caregiver may provide home test kits to check for hidden blood in the stool. Use of a small camera at the end of a tube, to directly examine the colon (sigmoidoscopy or colonoscopy), can detect the earliest forms of colorectal cancer. Talk to your caregiver about this at age 86, when routine screening begins. Direct examination of the colon should be repeated every 5 to 10 years through age 13, unless early forms of pre-cancerous polyps or small growths are found.  Hepatitis C blood testing is recommended for all people born from [birth year] through 1967 and any  individual with known risks for hepatitis C.  Practice safe sex. Use condoms and avoid high-risk sexual practices to reduce the spread of sexually transmitted infections (STIs). Sexually active women aged 36 and younger should be checked for Chlamydia, which is a common sexually transmitted infection. Older women with new or multiple partners should also be tested for Chlamydia. Testing for other STIs is recommended if you are sexually active and at increased risk.  Osteoporosis is a disease in which the bones lose minerals and strength with aging. This can result in serious bone fractures. The risk of osteoporosis can be identified using a bone density scan. Women ages 20 and over and women at risk for fractures or osteoporosis should discuss screening with their caregivers. Ask your caregiver whether you should be taking a calcium supplement or vitamin D to reduce the rate of osteoporosis.  Menopause can be associated with physical symptoms and risks. Hormone replacement therapy is available to decrease symptoms and risks. You should talk to your caregiver about whether hormone replacement therapy is right for you.  Use sunscreen. Apply sunscreen liberally and repeatedly throughout the day. You should seek shade when your shadow is shorter than you. Protect yourself by wearing long sleeves, pants, a wide-brimmed hat, and sunglasses year round, whenever you are outdoors.  Notify your caregiver of new moles or changes in moles, especially if there is a change in shape or color. Also notify your caregiver if a mole is larger than the size of a pencil eraser.  Stay current with your immunizations. Document Released: 07/01/2011 Document Revised: 04/12/2013 Document Reviewed: 07/01/2011 [facility] Patient Information 2014 [employer].

## 2015-05-06 LAB — URINALYSIS W MICROSCOPIC + REFLEX CULTURE
Bacteria, UA: NONE SEEN
Bilirubin Urine: NEGATIVE
CASTS: NONE SEEN
CRYSTALS: NONE SEEN
Glucose, UA: NEGATIVE mg/dL
Hgb urine dipstick: NEGATIVE
Ketones, ur: NEGATIVE mg/dL
Leukocytes, UA: NEGATIVE
NITRITE: NEGATIVE
PH: 6 (ref 5.0–8.0)
Protein, ur: NEGATIVE mg/dL
SPECIFIC GRAVITY, URINE: 1.021 (ref 1.005–1.030)
Urobilinogen, UA: 0.2 mg/dL (ref 0.0–1.0)

## 2015-05-17 ENCOUNTER — Other Ambulatory Visit: Payer: Self-pay | Admitting: Gynecology

## 2015-06-27 ENCOUNTER — Encounter: Payer: Self-pay | Admitting: Gynecology

## 2016-04-30 ENCOUNTER — Telehealth: Payer: Self-pay | Admitting: *Deleted

## 2016-04-30 MED ORDER — ESTRADIOL 1 MG PO TABS
1.0000 mg | ORAL_TABLET | Freq: Every day | ORAL | Status: DC
Start: 2016-04-30 — End: 2016-06-20

## 2016-04-30 NOTE — Telephone Encounter (Signed)
Pt called requesting 90 day supply of estrace 1 mg to local pharmacy. Rx sent.

## 2016-06-20 ENCOUNTER — Encounter: Payer: Self-pay | Admitting: Gynecology

## 2016-06-20 ENCOUNTER — Ambulatory Visit (INDEPENDENT_AMBULATORY_CARE_PROVIDER_SITE_OTHER): Payer: BLUE CROSS/BLUE SHIELD | Admitting: Gynecology

## 2016-06-20 VITALS — BP 120/74 | Ht 67.0 in | Wt 183.0 lb

## 2016-06-20 DIAGNOSIS — Z1329 Encounter for screening for other suspected endocrine disorder: Secondary | ICD-10-CM

## 2016-06-20 DIAGNOSIS — Z01419 Encounter for gynecological examination (general) (routine) without abnormal findings: Secondary | ICD-10-CM

## 2016-06-20 DIAGNOSIS — Z113 Encounter for screening for infections with a predominantly sexual mode of transmission: Secondary | ICD-10-CM

## 2016-06-20 DIAGNOSIS — Z13220 Encounter for screening for lipoid disorders: Secondary | ICD-10-CM | POA: Diagnosis not present

## 2016-06-20 DIAGNOSIS — Z79890 Hormone replacement therapy (postmenopausal): Secondary | ICD-10-CM

## 2016-06-20 LAB — CBC WITH DIFFERENTIAL/PLATELET
BASOS PCT: 0 %
Basophils Absolute: 0 cells/uL (ref 0–200)
Eosinophils Absolute: 210 cells/uL (ref 15–500)
Eosinophils Relative: 3 %
HCT: 39.9 % (ref 35.0–45.0)
Hemoglobin: 13.3 g/dL (ref 11.7–15.5)
LYMPHS PCT: 26 %
Lymphs Abs: 1820 cells/uL (ref 850–3900)
MCH: 33.3 pg — ABNORMAL HIGH (ref 27.0–33.0)
MCHC: 33.3 g/dL (ref 32.0–36.0)
MCV: 99.8 fL (ref 80.0–100.0)
MONO ABS: 490 {cells}/uL (ref 200–950)
MONOS PCT: 7 %
MPV: 12 fL (ref 7.5–12.5)
NEUTROS ABS: 4480 {cells}/uL (ref 1500–7800)
Neutrophils Relative %: 64 %
Platelets: 209 10*3/uL (ref 140–400)
RBC: 4 MIL/uL (ref 3.80–5.10)
RDW: 13.7 % (ref 11.0–15.0)
WBC: 7 10*3/uL (ref 3.8–10.8)

## 2016-06-20 LAB — COMPREHENSIVE METABOLIC PANEL
ALBUMIN: 3.9 g/dL (ref 3.6–5.1)
ALT: 24 U/L (ref 6–29)
AST: 27 U/L (ref 10–30)
Alkaline Phosphatase: 67 U/L (ref 33–115)
BUN: 12 mg/dL (ref 7–25)
CALCIUM: 9.1 mg/dL (ref 8.6–10.2)
CO2: 24 mmol/L (ref 20–31)
Chloride: 103 mmol/L (ref 98–110)
Creat: 0.7 mg/dL (ref 0.50–1.10)
Glucose, Bld: 93 mg/dL (ref 65–99)
POTASSIUM: 3.8 mmol/L (ref 3.5–5.3)
SODIUM: 137 mmol/L (ref 135–146)
Total Bilirubin: 0.3 mg/dL (ref 0.2–1.2)
Total Protein: 6.3 g/dL (ref 6.1–8.1)

## 2016-06-20 LAB — LIPID PANEL
CHOL/HDL RATIO: 2 ratio (ref <=5.0)
Cholesterol: 161 mg/dL (ref 125–200)
HDL: 82 mg/dL (ref >=46)
LDL CALC: 53 mg/dL (ref <130)
TRIGLYCERIDES: 130 mg/dL (ref <150)
VLDL: 26 mg/dL (ref <30)

## 2016-06-20 LAB — TSH: TSH: 2.36 mIU/L

## 2016-06-20 MED ORDER — ESTRADIOL 1 MG PO TABS: 1.0000 mg | ORAL_TABLET | Freq: Every day | ORAL | Status: DC

## 2016-06-20 MED ORDER — OMEPRAZOLE 20 MG PO CPDR: 20.0000 mg | DELAYED_RELEASE_CAPSULE | Freq: Every day | ORAL | Status: DC

## 2016-06-20 NOTE — Patient Instructions (Signed)

## 2016-06-20 NOTE — Progress Notes (Signed)
    Latoya [name] [birth year] [id number]        39 y.o.  G1P1001  for annual exam.  Doing well. Several issues noted below.  Past medical history,surgical history, problem list, medications, allergies, family history and social history were all reviewed and documented as reviewed in the EPIC chart.  ROS:  Performed with pertinent positives and negatives included in the history, assessment and plan.   Additional significant findings :  None   Exam: [name] assistant Filed Vitals:   06/20/16 1528  BP: 120/74  Height: 5' 7"  (1.702 m)  Weight: 183 lb (83.008 kg)   General appearance:  Normal affect, orientation and appearance. Skin: Grossly normal HEENT: Without gross lesions.  No cervical or supraclavicular adenopathy. Thyroid normal.  Lungs:  Clear without wheezing, rales or rhonchi Cardiac: RR, without RMG Abdominal:  Soft, nontender, without masses, guarding, rebound, organomegaly or hernia Breasts:  Examined lying and sitting without masses, retractions, discharge or axillary adenopathy. Pelvic:  Ext/BUS/vaginaNormal. GC/Chlamydia of vaginal vault and urethra done  Adnexa without masses or tenderness    Anus and perineum normal   Rectovaginal normal sphincter tone without palpated masses or tenderness.    Assessment/Plan:  39 y.o. G13P1001 female for annual exam.   1. Postmenopausal/HRT. History of LAVH 2007 with subs can BSO for endometriosis on estradiol 1 mg daily. Patient wants to continue. I reviewed most recent [facility] 2017 HRT statements. Risks to include thrombosis and possible breast cancer versus benefits particular cardiovascular and bone health reviewed. Patient wants to continue and I refilled her 1 year. 2. Pap smear 2015. No Pap smear done today. No history of significant abnormal's. Options to stop screening versus less frequent screening intervals reviewed per current screening guidelines. Will readdress on an annual basis. 3. Mammography 05/2015. Patient has  scheduled. SBE monthly reviewed. 4. History of GERD. Using OTC Prilosec and asked if I could give her the prescription strength which she thinks would be cheaper. Prilosec 20 mg 1 year refill provided. 5. Continues to smoke despite multiple recommendations to stop. 6. STD screening. Patient in new relationship and asked to do baseline STD screening. No known exposure but would like to be screened. GC/Chlamydia, hepatitis B, hepatitis C, HIV, RPR ordered. 7. Health maintenance. Requests baseline lab work CBC, CMP, TSH and urinalysis ordered along with her STD lab work. Follow up in one year, sooner as needed.   Anastasio Auerbach MD, 4:33 PM 06/20/2016

## 2016-06-21 DIAGNOSIS — Z803 Family history of malignant neoplasm of breast: Secondary | ICD-10-CM | POA: Diagnosis not present

## 2016-06-21 DIAGNOSIS — Z1231 Encounter for screening mammogram for malignant neoplasm of breast: Secondary | ICD-10-CM | POA: Diagnosis not present

## 2016-06-21 LAB — URINALYSIS W MICROSCOPIC + REFLEX CULTURE
BACTERIA UA: NONE SEEN [HPF]
BILIRUBIN URINE: NEGATIVE
CRYSTALS: NONE SEEN [HPF]
Casts: NONE SEEN [LPF]
GLUCOSE, UA: NEGATIVE
HGB URINE DIPSTICK: NEGATIVE
Ketones, ur: NEGATIVE
LEUKOCYTES UA: NEGATIVE
Nitrite: NEGATIVE
PROTEIN: NEGATIVE
RBC / HPF: NONE SEEN RBC/HPF (ref <=2)
Specific Gravity, Urine: 1.009 (ref 1.001–1.035)
WBC UA: NONE SEEN WBC/HPF (ref <=5)
Yeast: NONE SEEN [HPF]
pH: 6 (ref 5.0–8.0)

## 2016-06-21 LAB — GC/CHLAMYDIA PROBE AMP
CT PROBE, AMP APTIMA: NOT DETECTED
GC Probe RNA: NOT DETECTED

## 2016-06-21 LAB — HEPATITIS B SURFACE ANTIGEN: Hepatitis B Surface Ag: NEGATIVE

## 2016-06-21 LAB — RPR

## 2016-06-21 LAB — HEPATITIS C ANTIBODY: HCV AB: NEGATIVE

## 2016-06-21 LAB — HIV ANTIBODY (ROUTINE TESTING W REFLEX): HIV 1&2 Ab, 4th Generation: NONREACTIVE

## 2016-07-08 ENCOUNTER — Encounter: Payer: Self-pay | Admitting: Gynecology

## 2016-10-05 DIAGNOSIS — M1000 Idiopathic gout, unspecified site: Secondary | ICD-10-CM | POA: Diagnosis not present

## 2017-01-08 ENCOUNTER — Other Ambulatory Visit: Payer: Self-pay

## 2017-01-08 MED ORDER — ESTRADIOL 1 MG PO TABS: 1.0000 mg | ORAL_TABLET | Freq: Every day | ORAL | 1 refills | Status: DC

## 2017-01-08 MED ORDER — OMEPRAZOLE 20 MG PO CPDR: 20.0000 mg | DELAYED_RELEASE_CAPSULE | Freq: Every day | ORAL | 1 refills | Status: DC

## 2017-01-20 ENCOUNTER — Telehealth: Payer: Self-pay | Admitting: *Deleted

## 2017-01-20 MED ORDER — ZOLMITRIPTAN 5 MG PO TABS: ORAL_TABLET | ORAL | 0 refills | Status: DC

## 2017-01-20 NOTE — Telephone Encounter (Signed)
Pt called requesting Rx for Zomig 5 mg #10 tablets for migraines states you have prescribed in past before. Had a urgent care doctor refill for her last year, and ran out this year of medication. Rare migraines. No PCP. Please advise

## 2017-01-20 NOTE — Telephone Encounter (Signed)
Pt informed, Rx has been sent.

## 2017-01-20 NOTE — Telephone Encounter (Signed)
Okay for Zomig 5 mg #10 one by mouth for migraine headache not to repeat within 24 hours no refill

## 2017-02-06 ENCOUNTER — Telehealth: Payer: Self-pay | Admitting: *Deleted

## 2017-02-06 MED ORDER — VARENICLINE TARTRATE 1 MG PO TABS: 1.0000 mg | ORAL_TABLET | Freq: Two times a day (BID) | ORAL | 2 refills | Status: DC

## 2017-02-06 MED ORDER — VARENICLINE TARTRATE 0.5 MG PO TABS: 0.5000 mg | ORAL_TABLET | Freq: Two times a day (BID) | ORAL | 1 refills | Status: DC

## 2017-02-06 NOTE — Telephone Encounter (Signed)
Dr.Fontaine can you let me know the quantity of tablets for the starter month and continuing month pack along with directions as it does not come up in epic when trying to prescribe medication. Please advise

## 2017-02-06 NOTE — Telephone Encounter (Signed)
The regiment is days 1 through 3, 0.5 mg daily, days 4 through 7, 0.5 mg twice a day, day 8 through the end of treatment 1 mg twice a day. Should continue for total of 12 weeks.  Usually it comes and kits with a starter card for that first week and then the full treatment dose for the remainder of the 12 weeks.

## 2017-02-06 NOTE — Telephone Encounter (Signed)
Okay for Chantix. Call in the starter month pack and then the 2 follow up month's packs for 3 months total. Tell patient most common side effects include GI cramping and diarrhea. Very unusual but reported side effects include emotional swings, depression and suicide ideation. Any of those feelings need to be reported ASAP.

## 2017-02-06 NOTE — Telephone Encounter (Signed)
Rx sent, pharmacy is aware how medication is prescribed.

## 2017-02-06 NOTE — Telephone Encounter (Signed)
Pt called asking if you would be willing to send in Rx for Chantix, patient is ready to stop smoking. Please advise

## 2017-05-23 ENCOUNTER — Other Ambulatory Visit: Payer: Self-pay | Admitting: Gynecology

## 2017-05-26 NOTE — Telephone Encounter (Signed)
Has patient used chantix before.  If so then ok for starter month pac and full doses 2 month refills.  12 week total.

## 2017-06-05 ENCOUNTER — Other Ambulatory Visit: Payer: Self-pay | Admitting: Gynecology

## 2017-06-27 ENCOUNTER — Encounter: Payer: Self-pay | Admitting: Gynecology

## 2017-06-27 ENCOUNTER — Ambulatory Visit (INDEPENDENT_AMBULATORY_CARE_PROVIDER_SITE_OTHER): Payer: BLUE CROSS/BLUE SHIELD | Admitting: Gynecology

## 2017-06-27 VITALS — BP 120/74 | Ht 66.0 in | Wt 192.0 lb

## 2017-06-27 DIAGNOSIS — Z79890 Hormone replacement therapy (postmenopausal): Secondary | ICD-10-CM

## 2017-06-27 DIAGNOSIS — Z803 Family history of malignant neoplasm of breast: Secondary | ICD-10-CM | POA: Diagnosis not present

## 2017-06-27 DIAGNOSIS — Z1231 Encounter for screening mammogram for malignant neoplasm of breast: Secondary | ICD-10-CM | POA: Diagnosis not present

## 2017-06-27 DIAGNOSIS — G43809 Other migraine, not intractable, without status migrainosus: Secondary | ICD-10-CM | POA: Diagnosis not present

## 2017-06-27 DIAGNOSIS — Z01419 Encounter for gynecological examination (general) (routine) without abnormal findings: Secondary | ICD-10-CM | POA: Diagnosis not present

## 2017-06-27 LAB — CBC WITH DIFFERENTIAL/PLATELET
BASOS PCT: 0 %
Basophils Absolute: 0 cells/uL (ref 0–200)
EOS ABS: 219 {cells}/uL (ref 15–500)
EOS PCT: 3 %
HCT: 37.5 % (ref 35.0–45.0)
Hemoglobin: 12.5 g/dL (ref 11.7–15.5)
Lymphocytes Relative: 30 %
Lymphs Abs: 2190 cells/uL (ref 850–3900)
MCH: 33.5 pg — ABNORMAL HIGH (ref 27.0–33.0)
MCHC: 33.3 g/dL (ref 32.0–36.0)
MCV: 100.5 fL — AB (ref 80.0–100.0)
MONOS PCT: 7 %
MPV: 11.4 fL (ref 7.5–12.5)
Monocytes Absolute: 511 cells/uL (ref 200–950)
NEUTROS ABS: 4380 {cells}/uL (ref 1500–7800)
Neutrophils Relative %: 60 %
PLATELETS: 229 10*3/uL (ref 140–400)
RBC: 3.73 MIL/uL — AB (ref 3.80–5.10)
RDW: 13.9 % (ref 11.0–15.0)
WBC: 7.3 10*3/uL (ref 3.8–10.8)

## 2017-06-27 LAB — COMPREHENSIVE METABOLIC PANEL
ALK PHOS: 59 U/L (ref 33–115)
ALT: 23 U/L (ref 6–29)
AST: 23 U/L (ref 10–30)
Albumin: 4.1 g/dL (ref 3.6–5.1)
BUN: 16 mg/dL (ref 7–25)
CO2: 22 mmol/L (ref 20–31)
CREATININE: 0.67 mg/dL (ref 0.50–1.10)
Calcium: 9 mg/dL (ref 8.6–10.2)
Chloride: 103 mmol/L (ref 98–110)
Glucose, Bld: 93 mg/dL (ref 65–99)
Potassium: 4.2 mmol/L (ref 3.5–5.3)
SODIUM: 137 mmol/L (ref 135–146)
TOTAL PROTEIN: 6.2 g/dL (ref 6.1–8.1)
Total Bilirubin: 0.3 mg/dL (ref 0.2–1.2)

## 2017-06-27 MED ORDER — ZOLMITRIPTAN 5 MG PO TABS: ORAL_TABLET | ORAL | 0 refills | Status: DC

## 2017-06-27 MED ORDER — VARENICLINE TARTRATE 1 MG PO TABS: 1.0000 mg | ORAL_TABLET | Freq: Two times a day (BID) | ORAL | 2 refills | Status: DC

## 2017-06-27 NOTE — Progress Notes (Signed)
    Galen Malkowski Dec 17, 1976 [id number]        41 y.o.  G1P1001 for annual exam.    Past medical history,surgical history, problem list, medications, allergies, family history and social history were all reviewed and documented as reviewed in the EPIC chart.  ROS:  Performed with pertinent positives and negatives included in the history, assessment and plan.   Additional significant findings :  None   Exam: Latoya [name] assistant Vitals:   06/27/17 1407  BP: 120/74  Weight: 192 lb (87.1 kg)  Height: 5' 6"  (1.676 m)   Body mass index is 30.99 kg/m.  General appearance:  Normal affect, orientation and appearance. Skin: Grossly normal HEENT: Without gross lesions.  No cervical or supraclavicular adenopathy. Thyroid normal.  Lungs:  Clear without wheezing, rales or rhonchi Cardiac: RR, without RMG Abdominal:  Soft, nontender, without masses, guarding, rebound, organomegaly or hernia Breasts:  Examined lying and sitting without masses, retractions, discharge or axillary adenopathy. Pelvic:  Ext, BUS, Vagina: Normal. Pap smear of cuff done  Adnexa: Without masses or tenderness    Anus and perineum: Normal   Rectovaginal: Normal sphincter tone without palpated masses or tenderness.    Assessment/Plan:  41 y.o. G41P1001 female for annual exam.   1. HRT. Status post LAVH 2007 with subsequent BSO for endometriosis. On estradiol 1 mg daily. I again reviewed the risks versus benefits particularly an early age. Symptom relief, cardiovascular and bone health versus risks of thrombosis in the breast cancer issue. We both feel comfortable with her continuing for now and refill 1 year provided. 2. Pap smear 2015. Pap smear done today. Options to stop screening based on hysterectomy history and current screening guidelines reviewed. Will readdress on annual basis. No history of abnormal Pap smears previously. 3. History of migraine headaches. On Zomig uses occasionally. Refill provided 1  year. 4. Finishing 3 months of Chantix and feels she needs another month or 2. Refill provided. Encouragement provided. 5. Mammography 05/2017. Continue with annual mammography when due. SBE monthly reviewed. Breast exam normal today. 6. Health maintenance. Baseline CBC and CMP done. Lipid profile, TSH normal last year and not repeated. Follow up in one year, sooner as needed.  Anastasio Auerbach MD, 2:43 PM 06/27/2017

## 2017-06-27 NOTE — Patient Instructions (Signed)
Follow up in one year, sooner as needed.

## 2017-06-27 NOTE — Addendum Note (Signed)
Addended by: Nelva Nay on: 06/27/2017 02:48 PM   Modules accepted: Orders

## 2017-07-01 LAB — PAP IG W/ RFLX HPV ASCU

## 2017-08-12 ENCOUNTER — Other Ambulatory Visit: Payer: Self-pay | Admitting: Gynecology

## 2017-08-25 ENCOUNTER — Other Ambulatory Visit: Payer: Self-pay | Admitting: Gynecology

## 2017-11-01 ENCOUNTER — Other Ambulatory Visit: Payer: Self-pay | Admitting: Gynecology

## 2018-01-20 ENCOUNTER — Other Ambulatory Visit: Payer: Self-pay | Admitting: Gynecology

## 2018-01-21 NOTE — Telephone Encounter (Signed)
I refilled her medication for 3 months.  She really needs to get further refills from her primary physician who probably started this.  This is not a medication a gynecologist usually prescribes on a long-term basis.

## 2018-01-21 NOTE — Telephone Encounter (Signed)
Left detailed message in voice mail advising patient  per DPR access note on file.

## 2018-04-04 ENCOUNTER — Other Ambulatory Visit: Payer: Self-pay | Admitting: Gynecology

## 2018-04-26 ENCOUNTER — Other Ambulatory Visit: Payer: Self-pay | Admitting: Gynecology

## 2018-05-29 ENCOUNTER — Other Ambulatory Visit: Payer: Self-pay | Admitting: Gynecology

## 2018-05-29 NOTE — Telephone Encounter (Signed)
CE scheduled 07/07/18  With Dr. Loetta Rough.

## 2018-06-16 ENCOUNTER — Other Ambulatory Visit: Payer: Self-pay | Admitting: Gynecology

## 2018-06-17 NOTE — Telephone Encounter (Signed)
CE scheduled 07/07/18.

## 2018-07-03 ENCOUNTER — Encounter: Payer: Self-pay | Admitting: Gynecology

## 2018-07-03 DIAGNOSIS — Z1231 Encounter for screening mammogram for malignant neoplasm of breast: Secondary | ICD-10-CM | POA: Diagnosis not present

## 2018-07-07 ENCOUNTER — Ambulatory Visit (INDEPENDENT_AMBULATORY_CARE_PROVIDER_SITE_OTHER): Payer: BLUE CROSS/BLUE SHIELD | Admitting: Gynecology

## 2018-07-07 ENCOUNTER — Encounter: Payer: Self-pay | Admitting: Gynecology

## 2018-07-07 ENCOUNTER — Other Ambulatory Visit: Payer: Self-pay | Admitting: Gynecology

## 2018-07-07 VITALS — BP 118/76 | Ht 66.0 in | Wt 194.0 lb

## 2018-07-07 DIAGNOSIS — Z13220 Encounter for screening for lipoid disorders: Secondary | ICD-10-CM

## 2018-07-07 DIAGNOSIS — Z79890 Hormone replacement therapy (postmenopausal): Secondary | ICD-10-CM | POA: Diagnosis not present

## 2018-07-07 DIAGNOSIS — Z01419 Encounter for gynecological examination (general) (routine) without abnormal findings: Secondary | ICD-10-CM

## 2018-07-07 DIAGNOSIS — R635 Abnormal weight gain: Secondary | ICD-10-CM | POA: Diagnosis not present

## 2018-07-07 MED ORDER — ESTRADIOL 1 MG PO TABS: 1.0000 mg | ORAL_TABLET | Freq: Every day | ORAL | 4 refills | Status: DC

## 2018-07-07 NOTE — Progress Notes (Signed)
    Latoya [name] May 05, 1976 [id number]        42 y.o.  G1P1001 for annual gynecologic exam.  Doing well without gynecologic complaints  Past medical history,surgical history, problem list, medications, allergies, family history and social history were all reviewed and documented as reviewed in the EPIC chart.  ROS:  Performed with pertinent positives and negatives included in the history, assessment and plan.   Additional significant findings : None   Exam: Latoya [name] assistant Vitals:   07/07/18 1212  BP: 118/76  Weight: 194 lb (88 kg)  Height: 5' 6"  (1.676 m)   Body mass index is 31.31 kg/m.  General appearance:  Normal affect, orientation and appearance. Skin: Grossly normal HEENT: Without gross lesions.  No cervical or supraclavicular adenopathy. Thyroid normal.  Lungs:  Clear without wheezing, rales or rhonchi Cardiac: RR, without RMG Abdominal:  Soft, nontender, without masses, guarding, rebound, organomegaly or hernia Breasts:  Examined lying and sitting without masses, retractions, discharge or axillary adenopathy. Pelvic:  Ext, BUS, Vagina: Normal  Adnexa: Without masses or tenderness    Anus and perineum: Normal   Rectovaginal: Normal sphincter tone without palpated masses or tenderness.    Assessment/Plan:  42 y.o. G23P1001 female for annual gynecologic exam.   1.  Status post LAVH 2007 with subsequent BSO for endometriosis.  On estradiol 1 mg daily.  We again discussed the risks versus benefits of ERT particularly in patient with premature menopause.  At this point the patient wants to continue.  We discussed the risks of thrombosis such as stroke heart attack DVT in the breast cancer issue.  Benefits as far as symptom relief as well as cardiovascular and bone health reviewed.  Refill x1 year provided. 2. Pap smear 2018.  No Pap smear done today.  No history of significant abnormal Pap smears.  Options to stop screening per current screening guidelines and  hysterectomy history reviewed.  Will readdress on an annual basis. 3. Mammography 06/2018.  Continue with annual mammography next year.  Breast exam normal today. 4. History of occasional migraine headaches.  Uses Zomig 5 mg occasionally.  Has  supply at home but will call when needs more. 5. Health maintenance.  CBC, CMP, lipid profile ordered.  TSH also ordered due to some mild weight gain and family history of thyroid dysfunction.  Follow-up in 1 year, sooner as needed.   Latoya Auerbach MD, 12:42 PM 07/07/2018

## 2018-07-07 NOTE — Patient Instructions (Signed)
Follow-up in 1 year for annual exam, sooner as needed.

## 2018-07-08 LAB — CBC WITH DIFFERENTIAL/PLATELET
BASOS ABS: 40 {cells}/uL (ref 0–200)
Basophils Relative: 0.5 %
EOS ABS: 119 {cells}/uL (ref 15–500)
Eosinophils Relative: 1.5 %
HEMATOCRIT: 35.8 % (ref 35.0–45.0)
Hemoglobin: 12.1 g/dL (ref 11.7–15.5)
LYMPHS ABS: 1683 {cells}/uL (ref 850–3900)
MCH: 33 pg (ref 27.0–33.0)
MCHC: 33.8 g/dL (ref 32.0–36.0)
MCV: 97.5 fL (ref 80.0–100.0)
MPV: 12 fL (ref 7.5–12.5)
Monocytes Relative: 7.8 %
NEUTROS PCT: 68.9 %
Neutro Abs: 5443 cells/uL (ref 1500–7800)
Platelets: 213 10*3/uL (ref 140–400)
RBC: 3.67 10*6/uL — ABNORMAL LOW (ref 3.80–5.10)
RDW: 12.8 % (ref 11.0–15.0)
TOTAL LYMPHOCYTE: 21.3 %
WBC: 7.9 10*3/uL (ref 3.8–10.8)
WBCMIX: 616 {cells}/uL (ref 200–950)

## 2018-07-08 LAB — COMPREHENSIVE METABOLIC PANEL
AG RATIO: 1.7 (calc) (ref 1.0–2.5)
ALT: 19 U/L (ref 6–29)
AST: 25 U/L (ref 10–30)
Albumin: 4.2 g/dL (ref 3.6–5.1)
Alkaline phosphatase (APISO): 101 U/L (ref 33–115)
BILIRUBIN TOTAL: 0.6 mg/dL (ref 0.2–1.2)
BUN: 14 mg/dL (ref 7–25)
CO2: 26 mmol/L (ref 20–32)
Calcium: 9.1 mg/dL (ref 8.6–10.2)
Chloride: 102 mmol/L (ref 98–110)
Creat: 0.66 mg/dL (ref 0.50–1.10)
Globulin: 2.5 g/dL (calc) (ref 1.9–3.7)
Glucose, Bld: 93 mg/dL (ref 65–99)
POTASSIUM: 4.3 mmol/L (ref 3.5–5.3)
Sodium: 137 mmol/L (ref 135–146)
Total Protein: 6.7 g/dL (ref 6.1–8.1)

## 2018-07-08 LAB — LIPID PANEL
CHOLESTEROL: 165 mg/dL (ref <200)
HDL: 85 mg/dL (ref >50)
LDL CHOLESTEROL (CALC): 59 mg/dL
Non-HDL Cholesterol (Calc): 80 mg/dL (calc) (ref <130)
Total CHOL/HDL Ratio: 1.9 (calc) (ref <5.0)
Triglycerides: 133 mg/dL (ref <150)

## 2018-07-08 LAB — TSH: TSH: 1.77 mIU/L

## 2018-11-04 ENCOUNTER — Other Ambulatory Visit: Payer: Self-pay | Admitting: Gynecology

## 2018-12-04 DIAGNOSIS — M79645 Pain in left finger(s): Secondary | ICD-10-CM | POA: Diagnosis not present

## 2018-12-29 DIAGNOSIS — S93505A Unspecified sprain of left lesser toe(s), initial encounter: Secondary | ICD-10-CM | POA: Diagnosis not present

## 2018-12-29 DIAGNOSIS — S93602A Unspecified sprain of left foot, initial encounter: Secondary | ICD-10-CM | POA: Diagnosis not present

## 2019-02-26 ENCOUNTER — Ambulatory Visit (INDEPENDENT_AMBULATORY_CARE_PROVIDER_SITE_OTHER): Payer: 59 | Admitting: Gynecology

## 2019-02-26 ENCOUNTER — Encounter: Payer: Self-pay | Admitting: Gynecology

## 2019-02-26 VITALS — BP 124/80

## 2019-02-26 DIAGNOSIS — N63 Unspecified lump in unspecified breast: Secondary | ICD-10-CM

## 2019-02-26 DIAGNOSIS — R21 Rash and other nonspecific skin eruption: Secondary | ICD-10-CM

## 2019-02-26 MED ORDER — NYSTATIN-TRIAMCINOLONE 100000-0.1 UNIT/GM-% EX OINT: 1.0000 "application " | TOPICAL_OINTMENT | Freq: Two times a day (BID) | CUTANEOUS | 1 refills | Status: DC

## 2019-02-26 NOTE — Progress Notes (Signed)
    Latoya [name] 1976/08/13 [id number]        43 y.o.  G1P1001 presents with a week history of rash under the left breast.  On self-exam this morning she felt a lump in this area.  Had not felt this before.  Last mammogram was normal in July.  Past medical history,surgical history, problem list, medications, allergies, family history and social history were all reviewed and documented in the EPIC chart.  Directed ROS with pertinent positives and negatives documented in the history of present illness/assessment and plan.  Exam: [name] assistant Vitals:   02/26/19 0931  BP: 124/80   General appearance:  Normal Both breasts examined lying and sitting.  Right with slight rash underneath the breast consistent with fungal.  No masses, retractions, discharge or adenopathy.  Left with rash under the breast consistent with fungal.  Slight nodularity along the crease of the breast where it meets the chest wall.  The area of the patient's pointing to is at the medial sternal aspect.  No clear masses noted.  Assessment/Plan:  43 y.o. G1P1001 with skin rash consistent with fungal under both breasts.  Mycolog cream twice daily as needed prescribed 30 g tube with 1 refill.  Prominence of breast tissue along the ridge where the left breast meets the chest wall.  No clear masses.  Recommend ultrasound and diagnostic mammography for completeness.  Various scenarios discussed with the patient.  She will follow-up for the studies and then will go from there.  If the studies are negative then we will continue with self breast exams and as long as this area does not change will follow.    Anastasio Auerbach MD, 9:44 AM 02/26/2019

## 2019-02-26 NOTE — Patient Instructions (Signed)
Apply the prescribed cream to the rash twice daily.  Follow-up if rash does not go away.  The mammogram facility should call you to arrange for the mammogram and ultrasound.  Call my office if you do not hear from them next week.

## 2019-03-03 ENCOUNTER — Telehealth: Payer: Self-pay | Admitting: *Deleted

## 2019-03-03 NOTE — Telephone Encounter (Signed)
Per note on 02/26/19 "Prominence of breast tissue along the ridge where the left breast meets the chest wall.  No clear masses.  Recommend ultrasound and diagnostic mammography for completeness.  Various scenarios discussed with the patient.  She will follow-up for the studies and then will go from there. "  patient scheduled on 03/09/19 @ 8:15am at [facility] order faxed patient aware.

## 2019-03-09 ENCOUNTER — Encounter: Payer: Self-pay | Admitting: Gynecology

## 2019-07-16 DIAGNOSIS — Z1231 Encounter for screening mammogram for malignant neoplasm of breast: Secondary | ICD-10-CM | POA: Diagnosis not present

## 2019-07-16 DIAGNOSIS — Z803 Family history of malignant neoplasm of breast: Secondary | ICD-10-CM | POA: Diagnosis not present

## 2019-07-19 ENCOUNTER — Encounter: Payer: Self-pay | Admitting: Gynecology

## 2019-07-21 ENCOUNTER — Other Ambulatory Visit: Payer: Self-pay

## 2019-07-22 ENCOUNTER — Ambulatory Visit (INDEPENDENT_AMBULATORY_CARE_PROVIDER_SITE_OTHER): Payer: BC Managed Care – PPO | Admitting: Gynecology

## 2019-07-22 ENCOUNTER — Encounter: Payer: Self-pay | Admitting: Gynecology

## 2019-07-22 VITALS — BP 120/76 | Ht 66.0 in | Wt 199.0 lb

## 2019-07-22 DIAGNOSIS — Z01419 Encounter for gynecological examination (general) (routine) without abnormal findings: Secondary | ICD-10-CM

## 2019-07-22 DIAGNOSIS — Z13220 Encounter for screening for lipoid disorders: Secondary | ICD-10-CM

## 2019-07-22 MED ORDER — ESTRADIOL 1 MG PO TABS: 1.0000 mg | ORAL_TABLET | Freq: Every day | ORAL | 4 refills | Status: DC

## 2019-07-22 NOTE — Patient Instructions (Signed)
Follow-up in 1 year for annual exam, sooner as needed.

## 2019-07-22 NOTE — Progress Notes (Signed)
    Latoya [name] 1976-10-10 [id number]        43 y.o.  G1P1001 for annual gynecologic exam.  Without gynecologic complaints.  Several issues noted below  Past medical history,surgical history, problem list, medications, allergies, family history and social history were all reviewed and documented as reviewed in the EPIC chart.  ROS:  Performed with pertinent positives and negatives included in the history, assessment and plan.   Additional significant findings : None   Exam: [name] assistant Vitals:   07/22/19 1454  BP: 120/76  Weight: 199 lb (90.3 kg)  Height: 5' 6"  (1.676 m)   Body mass index is 32.12 kg/m.  General appearance:  Normal affect, orientation and appearance. Skin: Grossly normal HEENT: Without gross lesions.  No cervical or supraclavicular adenopathy. Thyroid normal.  Lungs:  Clear without wheezing, rales or rhonchi Cardiac: RR, without RMG Abdominal:  Soft, nontender, without masses, guarding, rebound, organomegaly or hernia Breasts:  Examined lying and sitting without masses, retractions, discharge or axillary adenopathy. Pelvic:  Ext, BUS, Vagina: Normal  Adnexa: Without masses or tenderness    Anus and perineum: Normal   Rectovaginal: Normal sphincter tone without palpated masses or tenderness.    Assessment/Plan:  43 y.o. G14P1001 female for annual gynecologic exam.  Status post LAVH with subsequent BSO for endometriosis.  1. HRT.  On estradiol 1 mg daily.  Doing well and wants to continue.  We again discussed the issues of thrombosis risks and the breast cancer issue.  Benefits from a cardiovascular and bone health as well as symptom relief when started early also discussed.  Refill x1 year provided. 2. Recent mammogram with area in left breast called for follow-up views scheduled this coming Monday.  Patient will follow-up for these.  Breast exam is normal today. 3. Pap smear 2018.  No Pap smear done today.  No history of abnormal Pap smears.   Options to stop screening per current screening guidelines based on hysterectomy versus less frequent screening intervals reviewed.  Will readdress on an annual basis. 4. Health maintenance.  Requests baseline labs.  CBC, CMP, lipid profile ordered.  TSH normal last year.  Follow-up 1 year, sooner as needed.   Anastasio Auerbach MD, 3:17 PM 07/22/2019

## 2019-07-23 LAB — COMPREHENSIVE METABOLIC PANEL
AG Ratio: 1.6 (calc) (ref 1.0–2.5)
ALT: 23 U/L (ref 6–29)
AST: 26 U/L (ref 10–35)
Albumin: 4 g/dL (ref 3.6–5.1)
Alkaline phosphatase (APISO): 53 U/L (ref 31–125)
BUN: 15 mg/dL (ref 7–25)
CO2: 20 mmol/L (ref 20–32)
Calcium: 9.1 mg/dL (ref 8.6–10.2)
Chloride: 104 mmol/L (ref 98–110)
Creat: 0.61 mg/dL (ref 0.50–1.10)
Globulin: 2.5 g/dL (calc) (ref 1.9–3.7)
Glucose, Bld: 92 mg/dL (ref 65–99)
Potassium: 4 mmol/L (ref 3.5–5.3)
Sodium: 134 mmol/L — ABNORMAL LOW (ref 135–146)
Total Bilirubin: 0.5 mg/dL (ref 0.2–1.2)
Total Protein: 6.5 g/dL (ref 6.1–8.1)

## 2019-07-23 LAB — LIPID PANEL
Cholesterol: 147 mg/dL (ref <200)
HDL: 83 mg/dL (ref >=50)
LDL Cholesterol (Calc): 42 mg/dL (calc)
Non-HDL Cholesterol (Calc): 64 mg/dL (calc) (ref <130)
Total CHOL/HDL Ratio: 1.8 (calc) (ref <5.0)
Triglycerides: 136 mg/dL (ref <150)

## 2019-07-23 LAB — CBC WITH DIFFERENTIAL/PLATELET
Absolute Monocytes: 559 cells/uL (ref 200–950)
Basophils Absolute: 57 cells/uL (ref 0–200)
Basophils Relative: 0.7 %
Eosinophils Absolute: 57 cells/uL (ref 15–500)
Eosinophils Relative: 0.7 %
HCT: 35.8 % (ref 35.0–45.0)
Hemoglobin: 12.2 g/dL (ref 11.7–15.5)
Lymphs Abs: 2098 cells/uL (ref 850–3900)
MCH: 33.2 pg — ABNORMAL HIGH (ref 27.0–33.0)
MCHC: 34.1 g/dL (ref 32.0–36.0)
MCV: 97.5 fL (ref 80.0–100.0)
MPV: 12 fL (ref 7.5–12.5)
Monocytes Relative: 6.9 %
Neutro Abs: 5330 cells/uL (ref 1500–7800)
Neutrophils Relative %: 65.8 %
Platelets: 216 10*3/uL (ref 140–400)
RBC: 3.67 10*6/uL — ABNORMAL LOW (ref 3.80–5.10)
RDW: 13.1 % (ref 11.0–15.0)
Total Lymphocyte: 25.9 %
WBC: 8.1 10*3/uL (ref 3.8–10.8)

## 2019-07-26 DIAGNOSIS — R928 Other abnormal and inconclusive findings on diagnostic imaging of breast: Secondary | ICD-10-CM | POA: Diagnosis not present

## 2019-08-17 ENCOUNTER — Encounter: Payer: Self-pay | Admitting: Gynecology

## 2019-09-02 DIAGNOSIS — Z20828 Contact with and (suspected) exposure to other viral communicable diseases: Secondary | ICD-10-CM | POA: Diagnosis not present

## 2019-09-21 ENCOUNTER — Encounter: Payer: Self-pay | Admitting: Gynecology

## 2019-09-22 DIAGNOSIS — Z23 Encounter for immunization: Secondary | ICD-10-CM | POA: Diagnosis not present

## 2020-03-03 ENCOUNTER — Ambulatory Visit: Payer: Self-pay | Attending: Internal Medicine

## 2020-03-03 DIAGNOSIS — Z23 Encounter for immunization: Secondary | ICD-10-CM | POA: Insufficient documentation

## 2020-03-03 NOTE — Progress Notes (Signed)
   Covid-19 Vaccination Clinic  Name:  Latoya [name]    MRN: [id number] DOB: 1976-11-14  03/03/2020  Latoya [name] was observed post Covid-19 immunization for 15 minutes without incident. She was provided with Vaccine Information Sheet and instruction to access the V-Safe system.   Latoya [name] was instructed to call 911 with any severe reactions post vaccine: Marland Kitchen Difficulty breathing  . Swelling of face and throat  . A fast heartbeat  . A bad rash all over body  . Dizziness and weakness

## 2020-03-25 ENCOUNTER — Ambulatory Visit: Payer: Self-pay | Attending: Internal Medicine

## 2020-03-25 DIAGNOSIS — Z23 Encounter for immunization: Secondary | ICD-10-CM

## 2020-03-25 NOTE — Progress Notes (Signed)
   Covid-19 Vaccination Clinic  Name:  Latoya [name]    MRN: [id number] DOB: 09-Oct-1976  03/25/2020  Latoya [name] was observed post Covid-19 immunization for 15 minutes without incident. She was provided with Vaccine Information Sheet and instruction to access the V-Safe system.   Latoya [name] was instructed to call 911 with any severe reactions post vaccine: Marland Kitchen Difficulty breathing  . Swelling of face and throat  . A fast heartbeat  . A bad rash all over body  . Dizziness and weakness   Immunizations Administered    Name Date Dose VIS Date Route   Pfizer COVID-19 Vaccine 03/25/2020  9:10 AM 0.3 mL 12/10/2019 Intramuscular   Manufacturer: [employer], [employer]   Lot: [id number]   [place]: [id number]

## 2020-04-04 ENCOUNTER — Ambulatory Visit: Payer: Self-pay

## 2020-05-02 ENCOUNTER — Other Ambulatory Visit: Payer: Self-pay

## 2020-05-02 MED ORDER — ZOLMITRIPTAN 5 MG PO TABS: ORAL_TABLET | ORAL | 3 refills | Status: DC

## 2020-05-02 NOTE — Telephone Encounter (Signed)
Dr. Phineas Real patient.  Current on Ce.  At 6//08/2017 visit he wrote "History of migraine headaches. On Zomig uses occasionally. Refill provided 1 year."  He prescribed them as she needed them.

## 2020-07-21 ENCOUNTER — Encounter: Payer: Self-pay | Admitting: Obstetrics and Gynecology

## 2020-07-31 ENCOUNTER — Ambulatory Visit (INDEPENDENT_AMBULATORY_CARE_PROVIDER_SITE_OTHER): Payer: 59 | Admitting: Obstetrics and Gynecology

## 2020-07-31 ENCOUNTER — Encounter: Payer: Self-pay | Admitting: Obstetrics and Gynecology

## 2020-07-31 ENCOUNTER — Other Ambulatory Visit: Payer: Self-pay

## 2020-07-31 VITALS — BP 126/80 | Ht 66.0 in | Wt 199.0 lb

## 2020-07-31 DIAGNOSIS — Z01419 Encounter for gynecological examination (general) (routine) without abnormal findings: Secondary | ICD-10-CM | POA: Diagnosis not present

## 2020-07-31 DIAGNOSIS — Z13220 Encounter for screening for lipoid disorders: Secondary | ICD-10-CM

## 2020-07-31 MED ORDER — ESTRADIOL 1 MG PO TABS: 1.0000 mg | ORAL_TABLET | Freq: Every day | ORAL | 4 refills | Status: DC

## 2020-07-31 NOTE — Progress Notes (Signed)
   Latoya [name] [birth year] [id number]  SUBJECTIVE:  44 y.o. G32P1001 female here for an annual routine gynecologic exam. She has no gynecologic concerns.  Current Outpatient Medications  Medication Sig Dispense Refill  . Cyanocobalamin (B-12 PO) Take by mouth.    . estradiol (ESTRACE) 1 MG tablet Take 1 tablet (1 mg total) by mouth daily. 90 tablet 4  . Fexofenadine-Pseudoephedrine (ALLEGRA-D PO) Take by mouth.    . Multiple Vitamin (MULTIVITAMIN) tablet Take 1 tablet by mouth daily.    Marland Kitchen nystatin-triamcinolone ointment (MYCOLOG) Apply 1 application topically 2 (two) times daily. 30 g 1  . omeprazole (PRILOSEC) 20 MG capsule TAKE 1 CAPSULE BY MOUTH  DAILY 90 capsule 0  . zolmitriptan (ZOMIG) 5 MG tablet Take 1 tab po for migraine headache. Do not repeat for 24 hours. 10 tablet 3   No current facility-administered medications for this visit.   Allergies: Codeine  Patient's last menstrual period was 12/30/2005.  Past medical history,surgical history, problem list, medications, allergies, family history and social history were all reviewed and documented as reviewed in the EPIC chart.  ROS:  Feeling well. No dyspnea or chest pain on exertion.  No abdominal pain, change in bowel habits, black or bloody stools.  No urinary tract symptoms. GYN ROS: no abnormal bleeding, pelvic pain or discharge, no breast pain or new or enlarging lumps on self exam.  No neurological complaints.   OBJECTIVE:  BP 126/80 (BP Location: Right Arm, Patient Position: Sitting, Cuff Size: Normal)   Ht 5' 6"  (1.676 m)   Wt 199 lb (90.3 kg)   LMP 12/30/2005   BMI 32.12 kg/m  The patient appears well, alert, oriented x 3, in no distress. ENT normal.  Neck supple. No cervical or supraclavicular adenopathy or thyromegaly.  Lungs are clear, good air entry, no wheezes, rhonchi or rales. S1 and S2 normal, no murmurs, regular rate and rhythm.  Abdomen soft without tenderness, guarding, mass or organomegaly.   Neurological is normal, no focal findings.  BREAST EXAM: breasts appear normal, no suspicious masses, no skin or nipple changes or axillary nodes  PELVIC EXAM: VULVA: normal appearing vulva with no masses, tenderness or lesions, VAGINA: normal appearing vagina with normal color and discharge, no lesions, CERVIX: surgically absent, UTERUS: surgically absent, vaginal cuff normal, ADNEXA: no masses, non tender  Chaperone: [name] present during the examination  ASSESSMENT:  44 y.o. G1P1001 here for annual gynecologic exam  PLAN:   1. Prior LAVH and subsequent BSO for endometriosis.  She is on HRT, taking 1 mg estradiol daily.  Risk of thrombotic diseases including heart attack, stroke, DVT, PE, and breast cancer are reviewed.  Bone health benefit reviewed.  She would like to continue as she is doing well.  Refill x1 year provided. 2. Pap smear 2018.  No reported history of abnormal Pap smears.  She is comfortable not screening based on current guidelines for the criteria of hysterectomy and absence of previous abnormal Pap smear history. 3. Mammogram 06/2020.  Normal breast exam today.  Continue with annual mammograms. 4. Health maintenance.  She will proceed to lab today for routine screening blood work (lipids, CBC, CMP).  Return annually or sooner, prn.  Joseph Pierini MD 07/31/20

## 2020-08-01 LAB — CBC
HCT: 37.3 % (ref 35.0–45.0)
Hemoglobin: 12.6 g/dL (ref 11.7–15.5)
MCH: 33.2 pg — ABNORMAL HIGH (ref 27.0–33.0)
MCHC: 33.8 g/dL (ref 32.0–36.0)
MCV: 98.4 fL (ref 80.0–100.0)
MPV: 12 fL (ref 7.5–12.5)
Platelets: 220 10*3/uL (ref 140–400)
RBC: 3.79 10*6/uL — ABNORMAL LOW (ref 3.80–5.10)
RDW: 12.5 % (ref 11.0–15.0)
WBC: 7.9 10*3/uL (ref 3.8–10.8)

## 2020-08-01 LAB — COMPREHENSIVE METABOLIC PANEL
AG Ratio: 1.6 (calc) (ref 1.0–2.5)
ALT: 35 U/L — ABNORMAL HIGH (ref 6–29)
AST: 48 U/L — ABNORMAL HIGH (ref 10–35)
Albumin: 4.1 g/dL (ref 3.6–5.1)
Alkaline phosphatase (APISO): 59 U/L (ref 31–125)
BUN: 13 mg/dL (ref 7–25)
CO2: 25 mmol/L (ref 20–32)
Calcium: 9.1 mg/dL (ref 8.6–10.2)
Chloride: 104 mmol/L (ref 98–110)
Creat: 0.71 mg/dL (ref 0.50–1.10)
Globulin: 2.5 g/dL (calc) (ref 1.9–3.7)
Glucose, Bld: 93 mg/dL (ref 65–99)
Potassium: 4.3 mmol/L (ref 3.5–5.3)
Sodium: 137 mmol/L (ref 135–146)
Total Bilirubin: 0.5 mg/dL (ref 0.2–1.2)
Total Protein: 6.6 g/dL (ref 6.1–8.1)

## 2020-08-01 LAB — LIPID PANEL
Cholesterol: 162 mg/dL (ref <200)
HDL: 98 mg/dL (ref >=50)
LDL Cholesterol (Calc): 46 mg/dL (calc)
Non-HDL Cholesterol (Calc): 64 mg/dL (calc) (ref <130)
Total CHOL/HDL Ratio: 1.7 (calc) (ref <5.0)
Triglycerides: 101 mg/dL (ref <150)

## 2020-08-04 ENCOUNTER — Other Ambulatory Visit: Payer: Self-pay | Admitting: *Deleted

## 2020-08-04 DIAGNOSIS — R748 Abnormal levels of other serum enzymes: Secondary | ICD-10-CM

## 2020-11-20 ENCOUNTER — Ambulatory Visit (INDEPENDENT_AMBULATORY_CARE_PROVIDER_SITE_OTHER): Payer: 59 | Admitting: Podiatry

## 2020-11-20 ENCOUNTER — Encounter: Payer: Self-pay | Admitting: Podiatry

## 2020-11-20 ENCOUNTER — Ambulatory Visit (INDEPENDENT_AMBULATORY_CARE_PROVIDER_SITE_OTHER): Payer: 59

## 2020-11-20 ENCOUNTER — Other Ambulatory Visit: Payer: Self-pay

## 2020-11-20 DIAGNOSIS — G5761 Lesion of plantar nerve, right lower limb: Secondary | ICD-10-CM

## 2020-11-20 DIAGNOSIS — S93401D Sprain of unspecified ligament of right ankle, subsequent encounter: Secondary | ICD-10-CM | POA: Diagnosis not present

## 2020-11-20 DIAGNOSIS — M79671 Pain in right foot: Secondary | ICD-10-CM

## 2020-11-20 DIAGNOSIS — S93401A Sprain of unspecified ligament of right ankle, initial encounter: Secondary | ICD-10-CM

## 2020-11-20 MED ORDER — DEXAMETHASONE SODIUM PHOSPHATE 4 MG/ML IJ SOLN
4.0000 mg | Freq: Once | INTRAMUSCULAR | Status: AC
  Administered 2020-11-20: 4 mg

## 2020-11-20 MED ORDER — TRIAMCINOLONE ACETONIDE 40 MG/ML IJ SUSP
10.0000 mg | Freq: Once | INTRAMUSCULAR | Status: AC
  Administered 2020-11-20: 10 mg

## 2020-11-20 NOTE — Progress Notes (Signed)
  Subjective:  Patient ID: Latoya [name], female    DOB: [birth year],  MRN: [id number]  Chief Complaint  Patient presents with  . Foot Pain    Pt stated that 6 years ago she was diagnosed with mortons neuroma and the last few months she has been having pain and the last 3 toes feel tight.     44 y.o. female presents with the above complaint. History confirmed with patient.  She was diagnosed with Morton's neuroma by podiatrist at [facility].  She also tweaked her ankle recently and felt like she turned inwards while playing tennis and has been sore  Objective:  Physical Exam: warm, good capillary refill, no trophic changes or ulcerative lesions, normal DP and PT pulses and normal sensory exam.  Right Foot: She has pain on palpation with a positive Mulder sign in the second interspace of the right foot as well as pain with lateral compression.  She has mild pain over the ATFL without gross instability and negative anterior drawer and talar tilt test.  Mild pain over the peroneal tendons  Radiographs: 3 weightbearing views of the right foot were taken.  No acute osseous abnormalities no degenerative changes or signs of fracture dislocation Assessment:   1. Foot pain, right   2. Morton's neuroma of second interspace of right foot   3. Mild sprain of right ankle, initial encounter      Plan:  Patient was evaluated and treated and all questions answered.   -Educated on etiology -Educated on padding and proper shoegear -XR reviewed with patient -Injection delivered to the affected interspaces  Procedure: Neuroma Injection Location: Right second interspace Skin Prep: Alcohol. Injectate: 1 cc 2% Xylocaine, 10 mg Kenalog, 4 mg dexamethasone Disposition: Patient tolerated procedure well. Injection site dressed with a band-aid.   Right ankle sprain -XR taken and reviewed with patient -Educated on etiology of injury -ASO Brace dispensed  Return in about 1 month (around  12/20/2020).

## 2020-11-29 ENCOUNTER — Other Ambulatory Visit: Payer: Self-pay | Admitting: Podiatry

## 2020-11-29 DIAGNOSIS — S93401D Sprain of unspecified ligament of right ankle, subsequent encounter: Secondary | ICD-10-CM

## 2020-12-12 DIAGNOSIS — L219 Seborrheic dermatitis, unspecified: Secondary | ICD-10-CM | POA: Diagnosis not present

## 2021-01-01 ENCOUNTER — Ambulatory Visit: Payer: 59 | Admitting: Podiatry

## 2021-01-02 ENCOUNTER — Ambulatory Visit (INDEPENDENT_AMBULATORY_CARE_PROVIDER_SITE_OTHER): Payer: BC Managed Care – PPO | Admitting: Podiatry

## 2021-01-02 ENCOUNTER — Other Ambulatory Visit: Payer: Self-pay

## 2021-01-02 ENCOUNTER — Encounter: Payer: Self-pay | Admitting: Podiatry

## 2021-01-02 DIAGNOSIS — G5761 Lesion of plantar nerve, right lower limb: Secondary | ICD-10-CM

## 2021-01-02 DIAGNOSIS — S93401D Sprain of unspecified ligament of right ankle, subsequent encounter: Secondary | ICD-10-CM

## 2021-01-02 DIAGNOSIS — M79671 Pain in right foot: Secondary | ICD-10-CM | POA: Diagnosis not present

## 2021-01-03 NOTE — Progress Notes (Signed)
  Subjective:  Patient ID: Latoya [name], female    DOB: 29-Dec-1976,  MRN: [id number]  Chief Complaint  Patient presents with  . Neuroma    F/u appointment , right foot, doing better but still numbness and a little pain w/movement    44 y.o. female returns with the above complaint. History confirmed with patient.  She is doing much better.  No pain in the ankle.  She does have some numbness in the foot but the pain is gone after the injection  Objective:  Physical Exam: warm, good capillary refill, no trophic changes or ulcerative lesions, normal DP and PT pulses and normal sensory exam.  Right Foot: No pain in lateral ankle along peroneals or the second inter-space of the foot  Radiographs: 3 weightbearing views of the right foot were taken.  No acute osseous abnormalities no degenerative changes or signs of fracture dislocation Assessment:   1. Foot pain, right   2. Morton's neuroma of second interspace of right foot   3. Mild sprain of right ankle, subsequent encounter      Plan:  Patient was evaluated and treated and all questions answered.   -Neuromas much improved status post injection.  Discussed further treatment for her and we will do this on a as needed basis with further injections   Right ankle sprain -Can resume full activities  Return if symptoms worsen or fail to improve.

## 2021-02-05 DIAGNOSIS — M5459 Other low back pain: Secondary | ICD-10-CM | POA: Diagnosis not present

## 2021-08-03 DIAGNOSIS — Z1231 Encounter for screening mammogram for malignant neoplasm of breast: Secondary | ICD-10-CM | POA: Diagnosis not present

## 2021-09-20 ENCOUNTER — Other Ambulatory Visit: Payer: Self-pay

## 2021-11-07 DIAGNOSIS — R6889 Other general symptoms and signs: Secondary | ICD-10-CM | POA: Diagnosis not present

## 2021-11-07 DIAGNOSIS — U071 COVID-19: Secondary | ICD-10-CM | POA: Diagnosis not present

## 2022-01-07 ENCOUNTER — Ambulatory Visit: Payer: BC Managed Care – PPO | Admitting: Nurse Practitioner

## 2022-01-08 ENCOUNTER — Ambulatory Visit: Payer: BC Managed Care – PPO | Admitting: Nurse Practitioner

## 2022-01-16 ENCOUNTER — Encounter: Payer: Self-pay | Admitting: Nurse Practitioner

## 2022-01-16 ENCOUNTER — Other Ambulatory Visit: Payer: Self-pay

## 2022-01-16 ENCOUNTER — Ambulatory Visit (INDEPENDENT_AMBULATORY_CARE_PROVIDER_SITE_OTHER): Payer: BC Managed Care – PPO | Admitting: Nurse Practitioner

## 2022-01-16 VITALS — BP 168/92 | Ht 65.5 in | Wt 207.0 lb

## 2022-01-16 DIAGNOSIS — Z01419 Encounter for gynecological examination (general) (routine) without abnormal findings: Secondary | ICD-10-CM | POA: Diagnosis not present

## 2022-01-16 DIAGNOSIS — Z780 Asymptomatic menopausal state: Secondary | ICD-10-CM | POA: Diagnosis not present

## 2022-01-16 DIAGNOSIS — R030 Elevated blood-pressure reading, without diagnosis of hypertension: Secondary | ICD-10-CM | POA: Diagnosis not present

## 2022-01-16 DIAGNOSIS — G43009 Migraine without aura, not intractable, without status migrainosus: Secondary | ICD-10-CM

## 2022-01-16 MED ORDER — ZOLMITRIPTAN 5 MG PO TABS: ORAL_TABLET | ORAL | 3 refills | Status: DC

## 2022-01-16 NOTE — Patient Instructions (Addendum)
Devra Dopp  Schedule Colonoscopy! [facility] [phone] [place], [place] [address]

## 2022-01-16 NOTE — Progress Notes (Signed)
Latoya [name] 10-07-1976 [id number]   History:  46 y.o. G1P1001 presents for annual exam. Postmenopausal - has been on ERT but ran out 1-2 weeks ago. She is experiencing some irritability but otherwise tolerating without. S/P 2007 TVH LSO with subsequent RSO in 2009 for endometriosis. Normal pap history. She had Covid back in November and has been experiencing fatigue since. Establishing with PCP in February or March.   Gynecologic History Patient's last menstrual period was 12/30/2005.   Contraception/Family planning: status post hysterectomy Sexually active: Yes  Health Maintenance Last Pap: 06/27/2017. Results were: Normal Last mammogram: 08/03/2021. Results were: Normal Last colonoscopy: Never Last Dexa: Not indicated  Past medical history, past surgical history, family history and social history were all reviewed and documented in the EPIC chart. Married. Works for [employer]. 44 yo daughter.   ROS:  A ROS was performed and pertinent positives and negatives are included.  Exam:  Vitals:   01/16/22 1431 01/16/22 1457  BP: (!) 172/98 (!) 168/92  Weight: 207 lb (93.9 kg)   Height: 5' 5.5" (1.664 m)    Body mass index is 33.92 kg/m.  General appearance:  Normal Thyroid:  Symmetrical, normal in size, without palpable masses or nodularity. Respiratory  Auscultation:  Clear without wheezing or rhonchi Cardiovascular  Auscultation:  Regular rate, without rubs, murmurs or gallops  Edema/varicosities:  Not grossly evident Abdominal  Soft,nontender, without masses, guarding or rebound.  Liver/spleen:  No organomegaly noted  Hernia:  None appreciated  Skin  Inspection:  Grossly normal Breasts: Examined lying and sitting.   Right: Without masses, retractions, nipple discharge or axillary adenopathy.   Left: Without masses, retractions, nipple discharge or axillary adenopathy. Genitourinary   Inguinal/mons:  Normal without inguinal adenopathy  External  genitalia:  Normal appearing vulva with no masses, tenderness, or lesions  BUS/Urethra/Skene's glands:  Normal  Vagina:  Normal appearing with normal color and discharge, no lesions  Cervix:  Absent  Uterus:  Absent  Adnexa/parametria:     Rt: Normal in size, without masses or tenderness.   Lt: Normal in size, without masses or tenderness.  Anus and perineum: Normal  Digital rectal exam: Normal sphincter tone without palpated masses or tenderness  Patient informed chaperone available to be present for breast and pelvic exam. Patient has requested no chaperone to be present. Patient has been advised what will be completed during breast and pelvic exam.   Assessment/Plan:  46 y.o. G1P1001 for annual exam.   Well female exam with routine gynecological exam - Education provided on SBEs, importance of preventative screenings, current guidelines, high calcium diet, regular exercise, and multivitamin daily. Plans to have labs done with PCP soon.   Postmenopausal - Was on ERT but ran out 1-2 weeks ago.  She is experiencing some irritability but otherwise tolerating without. At this time it is not recommended she restart due to elevated blood pressure and she understands. S/P 2007 TVH LSO with subsequent RSO in 2009 for endometriosis.  Migraine without aura and without status migrainosus, not intractable - Plan: zolmitriptan (ZOMIG) 5 MG tablet for migraine headache. She takes about 3 x per month and many times takes 1/2 tablet with relief of headache.   Elevated BP without diagnosis of hypertension - BP today 172/98, recheck 168/92. She reports having similar blood pressure when she was diagnosed with Covid in November 2022. Since virus she has had fatigue but denies headache, blurred vision, chest pain, shortness of breath, lightheadedness. She is establishing with PCP in February  or March but it is recommended she call to be seen sooner due to elevated blood pressure. She is aware she should present  to ER if she experiences any of the symptoms mentioned above. We will also not restart HRT at this time.   Screening for cervical cancer - Normal Pap history. No longer screening per guidelines.   Screening for breast cancer - Normal mammogram history.  Continue annual screenings.  Normal breast exam today.  Screening for colon cancer - Discussed current guideline and recommendations to start screenings at age 49. Information provided on [facility].   Return in 1 year for annual.     Tamela Gammon DNP, 3:31 PM 01/16/2022

## 2022-03-06 ENCOUNTER — Ambulatory Visit (INDEPENDENT_AMBULATORY_CARE_PROVIDER_SITE_OTHER): Payer: BC Managed Care – PPO | Admitting: Internal Medicine

## 2022-03-06 ENCOUNTER — Other Ambulatory Visit: Payer: Self-pay

## 2022-03-06 ENCOUNTER — Encounter: Payer: Self-pay | Admitting: Internal Medicine

## 2022-03-06 VITALS — BP 164/98 | HR 98 | Temp 98.6°F | Resp 16 | Ht 65.5 in | Wt 208.0 lb

## 2022-03-06 DIAGNOSIS — Z1211 Encounter for screening for malignant neoplasm of colon: Secondary | ICD-10-CM

## 2022-03-06 DIAGNOSIS — I10 Essential (primary) hypertension: Secondary | ICD-10-CM | POA: Insufficient documentation

## 2022-03-06 DIAGNOSIS — Z0001 Encounter for general adult medical examination with abnormal findings: Secondary | ICD-10-CM | POA: Diagnosis not present

## 2022-03-06 DIAGNOSIS — K7010 Alcoholic hepatitis without ascites: Secondary | ICD-10-CM

## 2022-03-06 LAB — HEPATIC FUNCTION PANEL
ALT: 66 U/L — ABNORMAL HIGH (ref 0–35)
AST: 66 U/L — ABNORMAL HIGH (ref 0–37)
Albumin: 4.5 g/dL (ref 3.5–5.2)
Alkaline Phosphatase: 68 U/L (ref 39–117)
Bilirubin, Direct: 0.1 mg/dL (ref 0.0–0.3)
Total Bilirubin: 0.4 mg/dL (ref 0.2–1.2)
Total Protein: 7.2 g/dL (ref 6.0–8.3)

## 2022-03-06 LAB — CBC WITH DIFFERENTIAL/PLATELET
Basophils Absolute: 0 10*3/uL (ref 0.0–0.1)
Basophils Relative: 0.2 % (ref 0.0–3.0)
Eosinophils Absolute: 0.1 10*3/uL (ref 0.0–0.7)
Eosinophils Relative: 1.2 % (ref 0.0–5.0)
HCT: 40.8 % (ref 36.0–46.0)
Hemoglobin: 13.5 g/dL (ref 12.0–15.0)
Lymphocytes Relative: 26 % (ref 12.0–46.0)
Lymphs Abs: 2 10*3/uL (ref 0.7–4.0)
MCHC: 33.2 g/dL (ref 30.0–36.0)
MCV: 100.1 fl — ABNORMAL HIGH (ref 78.0–100.0)
Monocytes Absolute: 0.9 10*3/uL (ref 0.1–1.0)
Monocytes Relative: 11.2 % (ref 3.0–12.0)
Neutro Abs: 4.7 10*3/uL (ref 1.4–7.7)
Neutrophils Relative %: 61.4 % (ref 43.0–77.0)
Platelets: 190 10*3/uL (ref 150.0–400.0)
RBC: 4.07 Mil/uL (ref 3.87–5.11)
RDW: 13.6 % (ref 11.5–15.5)
WBC: 7.7 10*3/uL (ref 4.0–10.5)

## 2022-03-06 LAB — LIPID PANEL
Cholesterol: 182 mg/dL (ref 0–200)
HDL: 72.5 mg/dL (ref >39.00)
LDL Cholesterol: 80 mg/dL (ref 0–99)
NonHDL: 109.79
Total CHOL/HDL Ratio: 3
Triglycerides: 148 mg/dL (ref 0.0–149.0)
VLDL: 29.6 mg/dL (ref 0.0–40.0)

## 2022-03-06 LAB — BASIC METABOLIC PANEL
BUN: 14 mg/dL (ref 6–23)
CO2: 27 mEq/L (ref 19–32)
Calcium: 9.2 mg/dL (ref 8.4–10.5)
Chloride: 102 mEq/L (ref 96–112)
Creatinine, Ser: 0.67 mg/dL (ref 0.40–1.20)
GFR: 103.9 mL/min (ref >60.00)
Glucose, Bld: 94 mg/dL (ref 70–99)
Potassium: 3.7 mEq/L (ref 3.5–5.1)
Sodium: 139 mEq/L (ref 135–145)

## 2022-03-06 LAB — CORTISOL: Cortisol, Plasma: 13.8 ug/dL

## 2022-03-06 LAB — TSH: TSH: 2.44 u[IU]/mL (ref 0.35–5.50)

## 2022-03-06 NOTE — Progress Notes (Addendum)
Subjective:  Patient ID: Latoya [name], female    DOB: February 19, 1976  Age: 46 y.o. MRN: [id number]  CC: Annual Exam and Hypertension  This visit occurred during the SARS-CoV-2 public health emergency.  Safety protocols were in place, including screening questions prior to the visit, additional usage of staff PPE, and extensive cleaning of exam room while observing appropriate contact time as indicated for disinfecting solutions.    HPI Latoya [name] presents for a CPX and to establish.  She plays tennis and does not experience chest pain, shortness of breath, diaphoresis, dizziness, lightheadedness, or edema.  She tells me her blood pressure at home has been elevated.  History Latoya [name] has a past medical history of Anemia, Bleeding gastric ulcer (11/2002), Endometriosis (2000), GERD (gastroesophageal reflux disease), History of blood transfusion (11/2002), Migraines, PAT (paroxysmal atrial tachycardia) ([place]), PONV (postoperative nausea and vomiting), and UTI (lower urinary tract infection).   She has a past surgical history that includes Vaginal hysterectomy (2007); Cesarean section (1996); Tubal ligation (03); Carpal tunnel release (2001?); Pelvic laparoscopy (2000); Anterior lumbar fusion (07/15/12); Salpingoophorectomy (2007; 2009); Roux-en-Y Gastric Bypass (2003); and Anterior lumbar fusion (07/15/2012).   Her family history includes Breast cancer (age of onset: 23) in her maternal aunt; Cancer in her father; Diabetes in her mother; Heart failure in her mother; Hypertension in her father and mother; Lung cancer in her mother; Stroke in her mother.She reports that she has been smoking e-cigarettes and cigarettes. She started smoking about 3 years ago. She has a 5.00 pack-year smoking history. She has never used smokeless tobacco. She reports current alcohol use of about 45.0 standard drinks per week. She reports that she does not use drugs.  Outpatient Medications Prior to Visit  Medication  Sig Dispense Refill   Biotin 1 MG CAPS      Cyanocobalamin (B-12 PO) Take by mouth.     Magnesium 100 MG CAPS      Multiple Vitamin (MULTIVITAMIN) tablet Take 1 tablet by mouth daily.     omeprazole (PRILOSEC) 20 MG capsule TAKE 1 CAPSULE BY MOUTH  DAILY 90 capsule 0   zolmitriptan (ZOMIG) 5 MG tablet Take 1 tab po for migraine headache. Do not repeat for 24 hours. 10 tablet 3   No facility-administered medications prior to visit.    ROS Review of Systems  Constitutional:  Positive for fatigue. Negative for appetite change, chills, diaphoresis, fever and unexpected weight change.  HENT: Negative.    Eyes: Negative.  Negative for visual disturbance.  Respiratory: Negative.  Negative for cough, chest tightness, shortness of breath and wheezing.   Cardiovascular:  Negative for chest pain, palpitations and leg swelling.  Gastrointestinal:  Negative for abdominal pain, constipation, diarrhea, nausea and vomiting.  Endocrine: Negative.   Genitourinary:  Negative for decreased urine volume, difficulty urinating, dysuria and hematuria.  Musculoskeletal: Negative.  Negative for arthralgias and myalgias.  Skin: Negative.  Negative for color change and pallor.  Allergic/Immunologic: Negative.   Neurological: Negative.  Negative for dizziness, weakness, light-headedness and headaches.  Hematological:  Negative for adenopathy. Does not bruise/bleed easily.  Psychiatric/Behavioral: Negative.     Objective:  BP (!) 164/98 (BP Location: Left Arm, Patient Position: Sitting, Cuff Size: Large)   Pulse 98   Temp 98.6 F (37 C) (Oral)   Resp 16   Ht 5' 5.5" (1.664 m)   Wt 208 lb (94.3 kg)   LMP 12/30/2005   SpO2 98%   BMI 34.09 kg/m   Physical Exam Vitals reviewed.  Constitutional:  Appearance: She is not ill-appearing.  HENT:     Nose: Nose normal.     Mouth/Throat:     Mouth: Mucous membranes are moist.  Eyes:     General: No scleral icterus.    Conjunctiva/sclera: Conjunctivae  normal.  Cardiovascular:     Rate and Rhythm: Normal rate and regular rhythm.     Heart sounds: Normal heart sounds, S1 normal and S2 normal.    No gallop.     Comments: EKG- NSR, 87 bpm ?LAE No LVH or Q waves Unchanged Pulmonary:     Effort: Pulmonary effort is normal.     Breath sounds: No stridor. No wheezing, rhonchi or rales.  Abdominal:     General: Abdomen is flat.     Palpations: There is no mass.     Tenderness: There is no abdominal tenderness. There is no guarding.     Hernia: No hernia is present.  Musculoskeletal:     Cervical back: Neck supple.     Right lower leg: No edema.     Left lower leg: No edema.  Lymphadenopathy:     Cervical: No cervical adenopathy.  Skin:    General: Skin is warm and dry.  Neurological:     General: No focal deficit present.     Mental Status: She is alert.  Psychiatric:        Mood and Affect: Mood normal.        Behavior: Behavior normal.    Lab Results  Component Value Date   WBC 7.7 03/06/2022   HGB 13.5 03/06/2022   HCT 40.8 03/06/2022   PLT 190.0 03/06/2022   GLUCOSE 94 03/06/2022   CHOL 182 03/06/2022   TRIG 148.0 03/06/2022   HDL 72.50 03/06/2022   LDLDIRECT 67.3 11/15/2014   LDLCALC 80 03/06/2022   ALT 66 (H) 03/06/2022   AST 66 (H) 03/06/2022   NA 139 03/06/2022   K 3.7 03/06/2022   CL 102 03/06/2022   CREATININE 0.67 03/06/2022   BUN 14 03/06/2022   CO2 27 03/06/2022   TSH 2.44 03/06/2022   INR 0.91 07/10/2012     Assessment & Plan:   Latoya [name] was seen today for annual exam and hypertension.  Diagnoses and all orders for this visit:  Encounter for general adult medical examination with abnormal findings- Exam completed, labs reviewed, vaccines reviewed and updated, cancer screenings addressed, patient education was given. -     Lipid panel; Future -     Lipid panel  Primary hypertension- She has developed stage II hypertension.  Her EKG is negative for LVH. I have asked her to decrease or abstain  from alcohol intake.  Will check labs to screen for secondary causes and endorgan damage.  Her K+ is 3.7. Will treat this with an ARB, triamterene, and hydrochlorothiazide. -     EKG 12-Lead -     Basic metabolic panel; Future -     CBC with Differential/Platelet; Future -     Aldosterone + renin activity w/ ratio; Future -     TSH; Future -     Urinalysis, Routine w reflex microscopic; Future -     Hepatic function panel; Future -     Cortisol; Future -     Cortisol -     Hepatic function panel -     Urinalysis, Routine w reflex microscopic -     TSH -     Aldosterone + renin activity w/ ratio -  CBC with Differential/Platelet -     Basic metabolic panel -     olmesartan (BENICAR) 20 MG tablet; Take 1 tablet (20 mg total) by mouth daily. -     triamterene-hydrochlorothiazide (DYAZIDE) 37.5-25 MG capsule; Take 1 each (1 capsule total) by mouth daily.  Alcoholic hepatitis without ascites- I have asked her to abstain or at least significantly decrease her alcohol intake.  Colon cancer screening -     Cologuard   I am having Latoya [name] start on olmesartan and triamterene-hydrochlorothiazide. I am also having her maintain her Cyanocobalamin (B-12 PO), multivitamin, omeprazole, Biotin, Magnesium, and zolmitriptan.  Meds ordered this encounter  Medications   olmesartan (BENICAR) 20 MG tablet    Sig: Take 1 tablet (20 mg total) by mouth daily.    Dispense:  90 tablet    Refill:  0   triamterene-hydrochlorothiazide (DYAZIDE) 37.5-25 MG capsule    Sig: Take 1 each (1 capsule total) by mouth daily.    Dispense:  90 capsule    Refill:  0     Follow-up: Return in about 3 months (around 06/06/2022).  Scarlette Calico, MD

## 2022-03-06 NOTE — Patient Instructions (Signed)

## 2022-03-07 DIAGNOSIS — K7010 Alcoholic hepatitis without ascites: Secondary | ICD-10-CM | POA: Insufficient documentation

## 2022-03-07 LAB — URINALYSIS, ROUTINE W REFLEX MICROSCOPIC
Bilirubin Urine: NEGATIVE
Hgb urine dipstick: NEGATIVE
Ketones, ur: NEGATIVE
Leukocytes,Ua: NEGATIVE
Nitrite: POSITIVE — AB
RBC / HPF: NONE SEEN (ref 0–?)
Specific Gravity, Urine: 1.015 (ref 1.000–1.030)
Total Protein, Urine: NEGATIVE
Urine Glucose: NEGATIVE
Urobilinogen, UA: 0.2 (ref 0.0–1.0)
pH: 7 (ref 5.0–8.0)

## 2022-03-07 MED ORDER — OLMESARTAN MEDOXOMIL 20 MG PO TABS
20.0000 mg | ORAL_TABLET | Freq: Every day | ORAL | 0 refills | Status: DC
Start: 2022-03-07 — End: 2022-06-02

## 2022-03-07 MED ORDER — TRIAMTERENE-HCTZ 37.5-25 MG PO CAPS: 1.0000 | ORAL_CAPSULE | Freq: Every day | ORAL | 0 refills | Status: DC

## 2022-03-10 DIAGNOSIS — Z1211 Encounter for screening for malignant neoplasm of colon: Secondary | ICD-10-CM | POA: Insufficient documentation

## 2022-03-27 DIAGNOSIS — Z1211 Encounter for screening for malignant neoplasm of colon: Secondary | ICD-10-CM | POA: Diagnosis not present

## 2022-03-27 LAB — ALDOSTERONE + RENIN ACTIVITY W/ RATIO
ALDO / PRA Ratio: 2.4 Ratio (ref 0.9–28.9)
Aldosterone: 3 ng/dL
Renin Activity: 1.27 ng/mL/h (ref 0.25–5.82)

## 2022-04-04 LAB — COLOGUARD: COLOGUARD: NEGATIVE

## 2022-06-02 ENCOUNTER — Other Ambulatory Visit: Payer: Self-pay | Admitting: Internal Medicine

## 2022-06-02 DIAGNOSIS — I10 Essential (primary) hypertension: Secondary | ICD-10-CM

## 2022-06-06 ENCOUNTER — Encounter: Payer: Self-pay | Admitting: Internal Medicine

## 2022-06-06 ENCOUNTER — Ambulatory Visit (INDEPENDENT_AMBULATORY_CARE_PROVIDER_SITE_OTHER): Payer: BC Managed Care – PPO | Admitting: Internal Medicine

## 2022-06-06 VITALS — BP 128/84 | HR 97 | Temp 98.1°F | Resp 16 | Ht 65.5 in | Wt 206.0 lb

## 2022-06-06 DIAGNOSIS — I10 Essential (primary) hypertension: Secondary | ICD-10-CM

## 2022-06-06 DIAGNOSIS — F5101 Primary insomnia: Secondary | ICD-10-CM | POA: Diagnosis not present

## 2022-06-06 LAB — BASIC METABOLIC PANEL
BUN: 28 mg/dL — ABNORMAL HIGH (ref 6–23)
CO2: 30 mEq/L (ref 19–32)
Calcium: 9.6 mg/dL (ref 8.4–10.5)
Chloride: 100 mEq/L (ref 96–112)
Creatinine, Ser: 0.95 mg/dL (ref 0.40–1.20)
GFR: 71.14 mL/min (ref >60.00)
Glucose, Bld: 92 mg/dL (ref 70–99)
Potassium: 4.7 mEq/L (ref 3.5–5.1)
Sodium: 138 mEq/L (ref 135–145)

## 2022-06-06 MED ORDER — TRAZODONE HCL 50 MG PO TABS: 50.0000 mg | ORAL_TABLET | Freq: Every day | ORAL | 0 refills | Status: DC

## 2022-06-06 NOTE — Patient Instructions (Signed)
Insomnia Insomnia is a sleep disorder that makes it difficult to fall asleep or stay asleep. Insomnia can cause fatigue, low energy, difficulty concentrating, mood swings, and poor performance at work or school. There are three different ways to classify insomnia: Difficulty falling asleep. Difficulty staying asleep. Waking up too early in the morning. Any type of insomnia can be long-term (chronic) or short-term (acute). Both are common. Short-term insomnia usually lasts for 3 months or less. Chronic insomnia occurs at least three times a week for longer than 3 months. What are the causes? Insomnia may be caused by another condition, situation, or substance, such as: Having certain mental health conditions, such as anxiety and depression. Using caffeine, alcohol, tobacco, or drugs. Having gastrointestinal conditions, such as gastroesophageal reflux disease (GERD). Having certain medical conditions. These include: Asthma. Alzheimer's disease. Stroke. Chronic pain. An overactive thyroid gland (hyperthyroidism). Other sleep disorders, such as restless legs syndrome and sleep apnea. Menopause. Sometimes, the cause of insomnia may not be known. What increases the risk? Risk factors for insomnia include: Gender. Females are affected more often than males. Age. Insomnia is more common as people get older. Stress and certain medical and mental health conditions. Lack of exercise. Having an irregular work schedule. This may include working night shifts and traveling between different time zones. What are the signs or symptoms? If you have insomnia, the main symptom is having trouble falling asleep or having trouble staying asleep. This may lead to other symptoms, such as: Feeling tired or having low energy. Feeling nervous about going to sleep. Not feeling rested in the morning. Having trouble concentrating. Feeling irritable, anxious, or depressed. How is this diagnosed? This condition  may be diagnosed based on: Your symptoms and medical history. Your health care provider may ask about: Your sleep habits. Any medical conditions you have. Your mental health. A physical exam. How is this treated? Treatment for insomnia depends on the cause. Treatment may focus on treating an underlying condition that is causing the insomnia. Treatment may also include: Medicines to help you sleep. Counseling or therapy. Lifestyle adjustments to help you sleep better. Follow these instructions at home: Eating and drinking  Limit or avoid alcohol, caffeinated beverages, and products that contain nicotine and tobacco, especially close to bedtime. These can disrupt your sleep. Do not eat a large meal or eat spicy foods right before bedtime. This can lead to digestive discomfort that can make it hard for you to sleep. Sleep habits  Keep a sleep diary to help you and your health care provider figure out what could be causing your insomnia. Write down: When you sleep. When you wake up during the night. How well you sleep and how rested you feel the next day. Any side effects of medicines you are taking. What you eat and drink. Make your bedroom a dark, comfortable place where it is easy to fall asleep. Put up shades or blackout curtains to block light from outside. Use a white noise machine to block noise. Keep the temperature cool. Limit screen use before bedtime. This includes: Not watching TV. Not using your smartphone, tablet, or computer. Stick to a routine that includes going to bed and waking up at the same times every day and night. This can help you fall asleep faster. Consider making a quiet activity, such as reading, part of your nighttime routine. Try to avoid taking naps during the day so that you sleep better at night. Get out of bed if you are still awake after  15 minutes of trying to sleep. Keep the lights down, but try reading or doing a quiet activity. When you feel  sleepy, go back to bed. General instructions Take over-the-counter and prescription medicines only as told by your health care provider. Exercise regularly as told by your health care provider. However, avoid exercising in the hours right before bedtime. Use relaxation techniques to manage stress. Ask your health care provider to suggest some techniques that may work well for you. These may include: Breathing exercises. Routines to release muscle tension. Visualizing peaceful scenes. Make sure that you drive carefully. Do not drive if you feel very sleepy. Keep all follow-up visits. This is important. Contact a health care provider if: You are tired throughout the day. You have trouble in your daily routine due to sleepiness. You continue to have sleep problems, or your sleep problems get worse. Get help right away if: You have thoughts about hurting yourself or someone else. Get help right away if you feel like you may hurt yourself or others, or have thoughts about taking your own life. Go to your nearest emergency room or: Call 911. Call the [facility] at [phone] or 988. This is open 24 hours a day. Text the Crisis Text Line at 862-084-3274. Summary Insomnia is a sleep disorder that makes it difficult to fall asleep or stay asleep. Insomnia can be long-term (chronic) or short-term (acute). Treatment for insomnia depends on the cause. Treatment may focus on treating an underlying condition that is causing the insomnia. Keep a sleep diary to help you and your health care provider figure out what could be causing your insomnia. This information is not intended to replace advice given to you by your health care provider. Make sure you discuss any questions you have with your health care provider. Document Revised: 11/26/2021 Document Reviewed: 11/26/2021 Elsevier Patient Education  [place].

## 2022-06-06 NOTE — Progress Notes (Signed)
Subjective:  Patient ID: Latoya [name], female    DOB: Aug 10, 1976  Age: 46 y.o. MRN: [id number]  CC: Hypertension   HPI Maricia Scotti presents for f/up -  She is working with a therapist to help with the alcohol usage.  She complains of insomnia with difficulty falling asleep and frequent awakenings.  She has lost weight with lifestyle modifications.  She denies dizziness, lightheadedness, chest pain, shortness of breath, or edema.  Outpatient Medications Prior to Visit  Medication Sig Dispense Refill   Biotin 1 MG CAPS      Cyanocobalamin (B-12 PO) Take by mouth.     Magnesium 100 MG CAPS      Multiple Vitamin (MULTIVITAMIN) tablet Take 1 tablet by mouth daily.     olmesartan (BENICAR) 20 MG tablet TAKE 1 TABLET BY MOUTH EVERY DAY 90 tablet 0   omeprazole (PRILOSEC) 20 MG capsule TAKE 1 CAPSULE BY MOUTH  DAILY 90 capsule 0   triamterene-hydrochlorothiazide (DYAZIDE) 37.5-25 MG capsule TAKE 1 EACH (1 CAPSULE TOTAL) BY MOUTH DAILY. 90 capsule 0   zolmitriptan (ZOMIG) 5 MG tablet Take 1 tab po for migraine headache. Do not repeat for 24 hours. 10 tablet 3   No facility-administered medications prior to visit.    ROS Review of Systems  Constitutional:  Negative for chills, diaphoresis, fatigue and fever.  HENT: Negative.    Eyes: Negative.   Respiratory: Negative.  Negative for cough, shortness of breath and wheezing.   Cardiovascular:  Negative for chest pain, palpitations and leg swelling.  Gastrointestinal:  Negative for abdominal pain, diarrhea, nausea and vomiting.  Endocrine: Negative.   Genitourinary: Negative.   Musculoskeletal: Negative.  Negative for myalgias.  Skin: Negative.   Neurological:  Negative for dizziness, weakness and light-headedness.  Hematological:  Negative for adenopathy. Does not bruise/bleed easily.  Psychiatric/Behavioral:  Positive for sleep disturbance. Negative for decreased concentration, dysphoric mood, hallucinations and suicidal ideas.  The patient is not hyperactive.     Objective:  BP 128/84 (BP Location: Right Arm, Patient Position: Sitting, Cuff Size: Large)   Pulse 97   Temp 98.1 F (36.7 C) (Oral)   Resp 16   Ht 5' 5.5" (1.664 m)   Wt 206 lb (93.4 kg)   LMP 12/30/2005   SpO2 98%   BMI 33.76 kg/m   BP Readings from Last 3 Encounters:  06/06/22 128/84  03/06/22 (!) 164/98  01/16/22 (!) 168/92    Wt Readings from Last 3 Encounters:  06/06/22 206 lb (93.4 kg)  03/06/22 208 lb (94.3 kg)  01/16/22 207 lb (93.9 kg)    Physical Exam Vitals reviewed.  HENT:     Nose: Nose normal.     Mouth/Throat:     Mouth: Mucous membranes are moist.  Eyes:     General: No scleral icterus.    Conjunctiva/sclera: Conjunctivae normal.  Cardiovascular:     Rate and Rhythm: Normal rate and regular rhythm.     Heart sounds: No murmur heard. Pulmonary:     Effort: Pulmonary effort is normal.     Breath sounds: No stridor. No wheezing, rhonchi or rales.  Abdominal:     General: Abdomen is flat.     Palpations: There is no mass.     Tenderness: There is no abdominal tenderness. There is no guarding.     Hernia: No hernia is present.  Musculoskeletal:        General: Normal range of motion.     Cervical back: Neck supple.  Right lower leg: No edema.     Left lower leg: No edema.  Lymphadenopathy:     Cervical: No cervical adenopathy.  Skin:    General: Skin is warm and dry.  Neurological:     General: No focal deficit present.     Mental Status: She is alert. Mental status is at baseline.  Psychiatric:        Mood and Affect: Mood normal.        Behavior: Behavior normal.     Lab Results  Component Value Date   WBC 7.7 03/06/2022   HGB 13.5 03/06/2022   HCT 40.8 03/06/2022   PLT 190.0 03/06/2022   GLUCOSE 92 06/06/2022   CHOL 182 03/06/2022   TRIG 148.0 03/06/2022   HDL 72.50 03/06/2022   LDLDIRECT 67.3 11/15/2014   LDLCALC 80 03/06/2022   ALT 66 (H) 03/06/2022   AST 66 (H) 03/06/2022   NA 138  06/06/2022   K 4.7 06/06/2022   CL 100 06/06/2022   CREATININE 0.95 06/06/2022   BUN 28 (H) 06/06/2022   CO2 30 06/06/2022   TSH 2.44 03/06/2022   INR 0.91 07/10/2012    No results found.  Assessment & Plan:   Alyxandra was seen today for hypertension.  Diagnoses and all orders for this visit:  Primary hypertension- Her blood pressure is well controlled.  Electrolytes and renal function are normal.  Will continue the current doses of olmesartan and Dyazide. -     Basic metabolic panel; Future -     Basic metabolic panel  Primary insomnia -     traZODone (DESYREL) 50 MG tablet; Take 1 tablet (50 mg total) by mouth at bedtime.   I am having Latoya [name] start on traZODone. I am also having her maintain her Cyanocobalamin (B-12 PO), multivitamin, omeprazole, Biotin, Magnesium, zolmitriptan, triamterene-hydrochlorothiazide, and olmesartan.  Meds ordered this encounter  Medications   traZODone (DESYREL) 50 MG tablet    Sig: Take 1 tablet (50 mg total) by mouth at bedtime.    Dispense:  90 tablet    Refill:  0     Follow-up: Return in about 6 months (around 12/06/2022).  Scarlette Calico, MD

## 2022-08-07 ENCOUNTER — Other Ambulatory Visit: Payer: Self-pay | Admitting: Internal Medicine

## 2022-08-07 ENCOUNTER — Encounter (INDEPENDENT_AMBULATORY_CARE_PROVIDER_SITE_OTHER): Payer: BC Managed Care – PPO | Admitting: Internal Medicine

## 2022-08-07 DIAGNOSIS — F1010 Alcohol abuse, uncomplicated: Secondary | ICD-10-CM

## 2022-08-07 MED ORDER — NALTREXONE HCL 50 MG PO TABS: 50.0000 mg | ORAL_TABLET | Freq: Every day | ORAL | 1 refills | Status: DC

## 2022-08-09 NOTE — Telephone Encounter (Signed)
Please see the MyChart message reply(ies) for my assessment and plan.  The patient gave consent for this Medical Advice Message and is aware that it may result in a bill to their insurance company as well as the possibility that this may result in a co-payment or deductible. They are an established patient, but are not seeking medical advice exclusively about a problem treated during an in person or video visit in the last 7 days. I did not recommend an in person or video visit within 7 days of my reply.  I spent a total of 10 minutes cumulative time within 7 days through MyChart messaging Audrey Thull, MD  

## 2022-08-23 DIAGNOSIS — Z1231 Encounter for screening mammogram for malignant neoplasm of breast: Secondary | ICD-10-CM | POA: Diagnosis not present

## 2022-08-27 ENCOUNTER — Encounter: Payer: Self-pay | Admitting: Nurse Practitioner

## 2022-09-02 ENCOUNTER — Other Ambulatory Visit: Payer: Self-pay | Admitting: Internal Medicine

## 2022-09-02 DIAGNOSIS — I10 Essential (primary) hypertension: Secondary | ICD-10-CM

## 2022-09-02 DIAGNOSIS — F5101 Primary insomnia: Secondary | ICD-10-CM

## 2022-11-14 ENCOUNTER — Other Ambulatory Visit: Payer: Self-pay | Admitting: Internal Medicine

## 2022-11-14 DIAGNOSIS — I10 Essential (primary) hypertension: Secondary | ICD-10-CM

## 2022-11-19 ENCOUNTER — Other Ambulatory Visit: Payer: Self-pay | Admitting: Internal Medicine

## 2022-11-19 DIAGNOSIS — I10 Essential (primary) hypertension: Secondary | ICD-10-CM

## 2022-11-19 DIAGNOSIS — F5101 Primary insomnia: Secondary | ICD-10-CM

## 2022-12-05 ENCOUNTER — Encounter: Payer: Self-pay | Admitting: Internal Medicine

## 2022-12-05 ENCOUNTER — Ambulatory Visit (INDEPENDENT_AMBULATORY_CARE_PROVIDER_SITE_OTHER): Payer: BC Managed Care – PPO | Admitting: Internal Medicine

## 2022-12-05 ENCOUNTER — Other Ambulatory Visit: Payer: Self-pay | Admitting: Internal Medicine

## 2022-12-05 VITALS — BP 126/72 | HR 83 | Temp 98.6°F | Ht 65.5 in | Wt 217.0 lb

## 2022-12-05 DIAGNOSIS — K7010 Alcoholic hepatitis without ascites: Secondary | ICD-10-CM

## 2022-12-05 DIAGNOSIS — D539 Nutritional anemia, unspecified: Secondary | ICD-10-CM

## 2022-12-05 DIAGNOSIS — I10 Essential (primary) hypertension: Secondary | ICD-10-CM | POA: Diagnosis not present

## 2022-12-05 LAB — BASIC METABOLIC PANEL
BUN: 24 mg/dL — ABNORMAL HIGH (ref 6–23)
CO2: 28 mEq/L (ref 19–32)
Calcium: 9.1 mg/dL (ref 8.4–10.5)
Chloride: 98 mEq/L (ref 96–112)
Creatinine, Ser: 0.96 mg/dL (ref 0.40–1.20)
GFR: 70.01 mL/min (ref >60.00)
Glucose, Bld: 101 mg/dL — ABNORMAL HIGH (ref 70–99)
Potassium: 3.8 mEq/L (ref 3.5–5.1)
Sodium: 135 mEq/L (ref 135–145)

## 2022-12-05 LAB — CBC WITH DIFFERENTIAL/PLATELET
Basophils Absolute: 0 10*3/uL (ref 0.0–0.1)
Basophils Relative: 0.5 % (ref 0.0–3.0)
Eosinophils Absolute: 0.2 10*3/uL (ref 0.0–0.7)
Eosinophils Relative: 2 % (ref 0.0–5.0)
HCT: 33.4 % — ABNORMAL LOW (ref 36.0–46.0)
Hemoglobin: 11.5 g/dL — ABNORMAL LOW (ref 12.0–15.0)
Lymphocytes Relative: 23.6 % (ref 12.0–46.0)
Lymphs Abs: 2 10*3/uL (ref 0.7–4.0)
MCHC: 34.5 g/dL (ref 30.0–36.0)
MCV: 97.7 fl (ref 78.0–100.0)
Monocytes Absolute: 0.9 10*3/uL (ref 0.1–1.0)
Monocytes Relative: 11 % (ref 3.0–12.0)
Neutro Abs: 5.4 10*3/uL (ref 1.4–7.7)
Neutrophils Relative %: 62.9 % (ref 43.0–77.0)
Platelets: 237 10*3/uL (ref 150.0–400.0)
RBC: 3.42 Mil/uL — ABNORMAL LOW (ref 3.87–5.11)
RDW: 13.3 % (ref 11.5–15.5)
WBC: 8.6 10*3/uL (ref 4.0–10.5)

## 2022-12-05 LAB — PROTIME-INR
INR: 0.9 ratio (ref 0.8–1.0)
Prothrombin Time: 10.2 s (ref 9.6–13.1)

## 2022-12-05 LAB — HEPATIC FUNCTION PANEL
ALT: 33 U/L (ref 0–35)
AST: 24 U/L (ref 0–37)
Albumin: 4.4 g/dL (ref 3.5–5.2)
Alkaline Phosphatase: 65 U/L (ref 39–117)
Bilirubin, Direct: 0.1 mg/dL (ref 0.0–0.3)
Total Bilirubin: 0.3 mg/dL (ref 0.2–1.2)
Total Protein: 7.3 g/dL (ref 6.0–8.3)

## 2022-12-05 NOTE — Patient Instructions (Signed)
Hypertension, Adult High blood pressure (hypertension) is when the force of blood pumping through the arteries is too strong. The arteries are the blood vessels that carry blood from the heart throughout the body. Hypertension forces the heart to work harder to pump blood and may cause arteries to become narrow or stiff. Untreated or uncontrolled hypertension can lead to a heart attack, heart failure, a stroke, kidney disease, and other problems. A blood pressure reading consists of a higher number over a lower number. Ideally, your blood pressure should be below 120/80. The first ("top") number is called the systolic pressure. It is a measure of the pressure in your arteries as your heart beats. The second ("bottom") number is called the diastolic pressure. It is a measure of the pressure in your arteries as the heart relaxes. What are the causes? The exact cause of this condition is not known. There are some conditions that result in high blood pressure. What increases the risk? Certain factors may make you more likely to develop high blood pressure. Some of these risk factors are under your control, including: Smoking. Not getting enough exercise or physical activity. Being overweight. Having too much fat, sugar, calories, or salt (sodium) in your diet. Drinking too much alcohol. Other risk factors include: Having a personal history of heart disease, diabetes, high cholesterol, or kidney disease. Stress. Having a family history of high blood pressure and high cholesterol. Having obstructive sleep apnea. Age. The risk increases with age. What are the signs or symptoms? High blood pressure may not cause symptoms. Very high blood pressure (hypertensive crisis) may cause: Headache. Fast or irregular heartbeats (palpitations). Shortness of breath. Nosebleed. Nausea and vomiting. Vision changes. Severe chest pain, dizziness, and seizures. How is this diagnosed? This condition is diagnosed by  measuring your blood pressure while you are seated, with your arm resting on a flat surface, your legs uncrossed, and your feet flat on the floor. The cuff of the blood pressure monitor will be placed directly against the skin of your upper arm at the level of your heart. Blood pressure should be measured at least twice using the same arm. Certain conditions can cause a difference in blood pressure between your right and left arms. If you have a high blood pressure reading during one visit or you have normal blood pressure with other risk factors, you may be asked to: Return on a different day to have your blood pressure checked again. Monitor your blood pressure at home for 1 week or longer. If you are diagnosed with hypertension, you may have other blood or imaging tests to help your health care provider understand your overall risk for other conditions. How is this treated? This condition is treated by making healthy lifestyle changes, such as eating healthy foods, exercising more, and reducing your alcohol intake. You may be referred for counseling on a healthy diet and physical activity. Your health care provider may prescribe medicine if lifestyle changes are not enough to get your blood pressure under control and if: Your systolic blood pressure is above 130. Your diastolic blood pressure is above 80. Your personal target blood pressure may vary depending on your medical conditions, your age, and other factors. Follow these instructions at home: Eating and drinking  Eat a diet that is high in fiber and potassium, and low in sodium, added sugar, and fat. An example of this eating plan is called the DASH diet. DASH stands for Dietary Approaches to Stop Hypertension. To eat this way: Eat   plenty of fresh fruits and vegetables. Try to fill one half of your plate at each meal with fruits and vegetables. Eat whole grains, such as whole-wheat pasta, brown rice, or whole-grain bread. Fill about one  fourth of your plate with whole grains. Eat or drink low-fat dairy products, such as skim milk or low-fat yogurt. Avoid fatty cuts of meat, processed or cured meats, and poultry with skin. Fill about one fourth of your plate with lean proteins, such as fish, chicken without skin, beans, eggs, or tofu. Avoid pre-made and processed foods. These tend to be higher in sodium, added sugar, and fat. Reduce your daily sodium intake. Many people with hypertension should eat less than 1,500 mg of sodium a day. Do not drink alcohol if: Your health care provider tells you not to drink. You are pregnant, may be pregnant, or are planning to become pregnant. If you drink alcohol: Limit how much you have to: 0-1 drink a day for women. 0-2 drinks a day for men. Know how much alcohol is in your drink. In the [place] one drink equals one 12 oz bottle of beer (355 mL), one 5 oz glass of wine (148 mL), or one 1 oz glass of hard liquor (44 mL). Lifestyle  Work with your health care provider to maintain a healthy body weight or to lose weight. Ask what an ideal weight is for you. Get at least 30 minutes of exercise that causes your heart to beat faster (aerobic exercise) most days of the week. Activities may include walking, swimming, or biking. Include exercise to strengthen your muscles (resistance exercise), such as Pilates or lifting weights, as part of your weekly exercise routine. Try to do these types of exercises for 30 minutes at least 3 days a week. Do not use any products that contain nicotine or tobacco. These products include cigarettes, chewing tobacco, and vaping devices, such as e-cigarettes. If you need help quitting, ask your health care provider. Monitor your blood pressure at home as told by your health care provider. Keep all follow-up visits. This is important. Medicines Take over-the-counter and prescription medicines only as told by your health care provider. Follow directions carefully. Blood  pressure medicines must be taken as prescribed. Do not skip doses of blood pressure medicine. Doing this puts you at risk for problems and can make the medicine less effective. Ask your health care provider about side effects or reactions to medicines that you should watch for. Contact a health care provider if you: Think you are having a reaction to a medicine you are taking. Have headaches that keep coming back (recurring). Feel dizzy. Have swelling in your ankles. Have trouble with your vision. Get help right away if you: Develop a severe headache or confusion. Have unusual weakness or numbness. Feel faint. Have severe pain in your chest or abdomen. Vomit repeatedly. Have trouble breathing. These symptoms may be an emergency. Get help right away. Call 911. Do not wait to see if the symptoms will go away. Do not drive yourself to the hospital. Summary Hypertension is when the force of blood pumping through your arteries is too strong. If this condition is not controlled, it may put you at risk for serious complications. Your personal target blood pressure may vary depending on your medical conditions, your age, and other factors. For most people, a normal blood pressure is less than 120/80. Hypertension is treated with lifestyle changes, medicines, or a combination of both. Lifestyle changes include losing weight, eating a healthy,   low-sodium diet, exercising more, and limiting alcohol. This information is not intended to replace advice given to you by your health care provider. Make sure you discuss any questions you have with your health care provider. Document Revised: 10/23/2021 Document Reviewed: 10/23/2021 Elsevier Patient Education  2023 [employer] Inc[employer]  

## 2022-12-05 NOTE — Progress Notes (Signed)
Subjective:  Patient ID: Latoya [name], female    DOB: [birth year]  Age: 46 y.o. MRN: [id number]  CC: Hypertension   HPI Fredrika Canby presents for f/up -  She has decreased her EtOH intake to 2 servings per day. Her BP has been well controlled.  Outpatient Medications Prior to Visit  Medication Sig Dispense Refill   Biotin 1 MG CAPS      Cyanocobalamin (B-12 PO) Take by mouth.     Magnesium 100 MG CAPS      Multiple Vitamin (MULTIVITAMIN) tablet Take 1 tablet by mouth daily.     olmesartan (BENICAR) 20 MG tablet TAKE 1 TABLET BY MOUTH EVERY DAY 90 tablet 0   omeprazole (PRILOSEC) 20 MG capsule TAKE 1 CAPSULE BY MOUTH  DAILY 90 capsule 0   traZODone (DESYREL) 50 MG tablet TAKE 1 TABLET BY MOUTH EVERYDAY AT BEDTIME 90 tablet 0   triamterene-hydrochlorothiazide (DYAZIDE) 37.5-25 MG capsule TAKE 1 CAPSULE BY MOUTH EVERY DAY 90 capsule 0   zolmitriptan (ZOMIG) 5 MG tablet Take 1 tab po for migraine headache. Do not repeat for 24 hours. 10 tablet 3   naltrexone (DEPADE) 50 MG tablet Take 1 tablet (50 mg total) by mouth daily. 90 tablet 1   No facility-administered medications prior to visit.    ROS Review of Systems  Constitutional: Negative.  Negative for diaphoresis and fatigue.  HENT: Negative.    Eyes: Negative.   Respiratory:  Negative for chest tightness, shortness of breath and wheezing.   Cardiovascular:  Negative for chest pain, palpitations and leg swelling.  Gastrointestinal:  Negative for abdominal pain, diarrhea, nausea and vomiting.  Endocrine: Negative.   Genitourinary:  Negative for difficulty urinating.  Musculoskeletal: Negative.  Negative for arthralgias and myalgias.  Skin: Negative.   Neurological: Negative.  Negative for dizziness and light-headedness.  Hematological:  Negative for adenopathy. Does not bruise/bleed easily.  Psychiatric/Behavioral:  Negative for dysphoric mood, sleep disturbance and suicidal ideas. The patient is not nervous/anxious.      Objective:  BP 126/72 (BP Location: Right Arm, Patient Position: Sitting, Cuff Size: Large)   Pulse 83   Temp 98.6 F (37 C) (Oral)   Ht 5' 5.5" (1.664 m)   Wt 217 lb (98.4 kg)   LMP 12/30/2005   SpO2 96%   BMI 35.56 kg/m   BP Readings from Last 3 Encounters:  12/05/22 126/72  06/06/22 128/84  03/06/22 (!) 164/98    Wt Readings from Last 3 Encounters:  12/05/22 217 lb (98.4 kg)  06/06/22 206 lb (93.4 kg)  03/06/22 208 lb (94.3 kg)    Physical Exam  Lab Results  Component Value Date   WBC 8.6 12/05/2022   HGB 11.5 (L) 12/05/2022   HCT 33.4 (L) 12/05/2022   PLT 237.0 12/05/2022   GLUCOSE 101 (H) 12/05/2022   CHOL 182 03/06/2022   TRIG 148.0 03/06/2022   HDL 72.50 03/06/2022   LDLDIRECT 67.3 11/15/2014   LDLCALC 80 03/06/2022   ALT 33 12/05/2022   AST 24 12/05/2022   NA 135 12/05/2022   K 3.8 12/05/2022   CL 98 12/05/2022   CREATININE 0.96 12/05/2022   BUN 24 (H) 12/05/2022   CO2 28 12/05/2022   TSH 2.44 03/06/2022   INR 0.9 12/05/2022    No results found.  Assessment & Plan:   Giavonna was seen today for hypertension.  Diagnoses and all orders for this visit:  Alcoholic hepatitis without ascites- Her LFT's have improved. -  Hepatic function panel; Future -     Protime-INR; Future -     Protime-INR -     Hepatic function panel  Primary hypertension- Her BP is well controlled. -     Basic metabolic panel; Future -     CBC with Differential/Platelet; Future -     CBC with Differential/Platelet -     Basic metabolic panel  Deficiency anemia- Will evaluate for vitamin deficiencies. -     IBC + Ferritin; Future -     Vitamin B1; Future -     Zinc; Future -     Folate; Future -     Vitamin B12; Future -     Reticulocytes; Future   I have discontinued Burnell Stankus's naltrexone. I am also having her maintain her Cyanocobalamin (B-12 PO), multivitamin, omeprazole, Biotin, Magnesium, zolmitriptan, triamterene-hydrochlorothiazide,  olmesartan, and traZODone.  No orders of the defined types were placed in this encounter.    Follow-up: Return in about 6 months (around 06/06/2023).  Sanda Linger, MD

## 2022-12-25 DIAGNOSIS — H40013 Open angle with borderline findings, low risk, bilateral: Secondary | ICD-10-CM | POA: Diagnosis not present

## 2023-01-03 DIAGNOSIS — L219 Seborrheic dermatitis, unspecified: Secondary | ICD-10-CM | POA: Diagnosis not present

## 2023-01-21 ENCOUNTER — Ambulatory Visit (INDEPENDENT_AMBULATORY_CARE_PROVIDER_SITE_OTHER): Payer: BC Managed Care – PPO | Admitting: Nurse Practitioner

## 2023-01-21 ENCOUNTER — Encounter: Payer: Self-pay | Admitting: Nurse Practitioner

## 2023-01-21 VITALS — BP 104/66 | Ht 66.0 in | Wt 220.0 lb

## 2023-01-21 DIAGNOSIS — E28319 Asymptomatic premature menopause: Secondary | ICD-10-CM

## 2023-01-21 DIAGNOSIS — G43009 Migraine without aura, not intractable, without status migrainosus: Secondary | ICD-10-CM

## 2023-01-21 DIAGNOSIS — Z01419 Encounter for gynecological examination (general) (routine) without abnormal findings: Secondary | ICD-10-CM

## 2023-01-21 DIAGNOSIS — Z13820 Encounter for screening for osteoporosis: Secondary | ICD-10-CM

## 2023-01-21 NOTE — Progress Notes (Signed)
Latoya [name] 1976-10-29 [id number]   History:  47 y.o. G1P1001 presents for annual exam. Postmenopausal - stopped ERT last year, currently taking OTC Estroven with good management of hot flashes and night sweats. S/P 2007 TVH LSO with subsequent RSO in 2009 for endometriosis (age 17). Normal pap history. HTN managed by PCP. Migraines occur every 2-3 months now instead of every 4-5 months. Usually managed with Excedrin but has triptan if needed. Vapes.   Gynecologic History Patient's last menstrual period was 12/30/2005.   Contraception/Family planning: status post hysterectomy Sexually active: Yes  Health Maintenance Last Pap: 06/27/2017. Results were: Normal Last mammogram: 08/23/2022. Results were: Normal Last colonoscopy: Never. Negative Cologuard 05/2022 Last Dexa: Not indicated  Past medical history, past surgical history, family history and social history were all reviewed and documented in the EPIC chart. Married. Works for [employer]. 37 yo daughter, just bought a house with her boyfriend.   ROS:  A ROS was performed and pertinent positives and negatives are included.  Exam:  Vitals:   01/21/23 0828  BP: 104/66  Weight: 220 lb (99.8 kg)  Height: 5\' 6"  (1.676 m)    Body mass index is 35.51 kg/m.  General appearance:  Normal Thyroid:  Symmetrical, normal in size, without palpable masses or nodularity. Respiratory  Auscultation:  Clear without wheezing or rhonchi Cardiovascular  Auscultation:  Regular rate, without rubs, murmurs or gallops  Edema/varicosities:  Not grossly evident Abdominal  Soft,nontender, without masses, guarding or rebound.  Liver/spleen:  No organomegaly noted  Hernia:  None appreciated  Skin  Inspection:  Grossly normal Breasts: Examined lying and sitting.   Right: Without masses, retractions, nipple discharge or axillary adenopathy.   Left: Without masses, retractions, nipple discharge or axillary adenopathy. Genitourinary    Inguinal/mons:  Normal without inguinal adenopathy  External genitalia:  Normal appearing vulva with no masses, tenderness, or lesions  BUS/Urethra/Skene's glands:  Normal  Vagina:  Normal appearing with normal color and discharge, no lesions  Cervix:  Absent  Uterus:  Absent  Adnexa/parametria:     Rt: Normal in size, without masses or tenderness.   Lt: Normal in size, without masses or tenderness.  Anus and perineum: Normal  Digital rectal exam: Deferred  Patient informed chaperone available to be present for breast and pelvic exam. Patient has requested no chaperone to be present. Patient has been advised what will be completed during breast and pelvic exam.   Assessment/Plan:  47 y.o. G1P1001 for annual exam.   Well female exam with routine gynecological exam - Education provided on SBEs, importance of preventative screenings, current guidelines, high calcium diet, regular exercise, and multivitamin daily. Labs with PCP.   Early menopause occurring in patient age younger than 1 years - Plan: DG Bone Density. Stopped ERT last year. Taking OTC estroven now with good management of hot flashes and night sweats. Has had increase in migraines slightly and is wondering if she needs to restart ERT. Not recommended due to vaping status and HTN and she understands. Migraines likely not related. S/P 2007 TVH LSO with subsequent RSO in 2009 (age 12) for endometriosis.  Screening for osteoporosis - Plan: DG Bone Density. Early menopause s/t surgery.   Migraine without aura and without status migrainosus, not intractable - Plan: zolmitriptan (ZOMIG) 5 MG tablet for migraine headache. Rarely takes. Usually controlled with Excedrin. Occur every 2-3 months.   Screening for cervical cancer - Normal Pap history. No longer screening per guidelines.   Screening for breast cancer -  Normal mammogram history.  Continue annual screenings.  Normal breast exam today.  Screening for colon cancer - Negative  Cologuard 05/2022. Recommendations to repeat in 3 years.  Return in 1 year for annual.     Tamela Gammon DNP, 8:56 AM 01/21/2023

## 2023-02-13 ENCOUNTER — Emergency Department (HOSPITAL_BASED_OUTPATIENT_CLINIC_OR_DEPARTMENT_OTHER)
Admission: EM | Admit: 2023-02-13 | Discharge: 2023-02-13 | Disposition: A | Discharge status: Home / Self Care | Payer: BC Managed Care – PPO | Attending: Emergency Medicine | Admitting: Emergency Medicine

## 2023-02-13 ENCOUNTER — Emergency Department (HOSPITAL_BASED_OUTPATIENT_CLINIC_OR_DEPARTMENT_OTHER): Payer: BC Managed Care – PPO

## 2023-02-13 ENCOUNTER — Other Ambulatory Visit: Payer: Self-pay

## 2023-02-13 ENCOUNTER — Emergency Department (HOSPITAL_BASED_OUTPATIENT_CLINIC_OR_DEPARTMENT_OTHER): Payer: BC Managed Care – PPO | Admitting: Radiology

## 2023-02-13 DIAGNOSIS — S99912A Unspecified injury of left ankle, initial encounter: Secondary | ICD-10-CM | POA: Diagnosis not present

## 2023-02-13 DIAGNOSIS — S01511A Laceration without foreign body of lip, initial encounter: Secondary | ICD-10-CM | POA: Insufficient documentation

## 2023-02-13 DIAGNOSIS — S8252XA Displaced fracture of medial malleolus of left tibia, initial encounter for closed fracture: Secondary | ICD-10-CM | POA: Diagnosis not present

## 2023-02-13 DIAGNOSIS — M7989 Other specified soft tissue disorders: Secondary | ICD-10-CM | POA: Diagnosis not present

## 2023-02-13 DIAGNOSIS — W19XXXA Unspecified fall, initial encounter: Secondary | ICD-10-CM | POA: Diagnosis not present

## 2023-02-13 DIAGNOSIS — S99921A Unspecified injury of right foot, initial encounter: Secondary | ICD-10-CM | POA: Diagnosis not present

## 2023-02-13 DIAGNOSIS — W01198A Fall on same level from slipping, tripping and stumbling with subsequent striking against other object, initial encounter: Secondary | ICD-10-CM | POA: Diagnosis not present

## 2023-02-13 DIAGNOSIS — S82842A Displaced bimalleolar fracture of left lower leg, initial encounter for closed fracture: Secondary | ICD-10-CM | POA: Diagnosis not present

## 2023-02-13 MED ORDER — SODIUM CHLORIDE 0.9 % IV SOLN
INTRAVENOUS | Status: AC | PRN
  Administered 2023-02-13: 1000 mL via INTRAVENOUS

## 2023-02-13 MED ORDER — OXYCODONE-ACETAMINOPHEN 5-325 MG PO TABS: 1.0000 | ORAL_TABLET | Freq: Four times a day (QID) | ORAL | 0 refills | Status: DC | PRN

## 2023-02-13 MED ORDER — PROPOFOL 10 MG/ML IV BOLUS
INTRAVENOUS | Status: AC | PRN
  Administered 2023-02-13 (×2): 40 mg via INTRAVENOUS

## 2023-02-13 MED ORDER — MORPHINE SULFATE (PF) 4 MG/ML IV SOLN
4.0000 mg | Freq: Once | INTRAVENOUS | Status: AC
  Administered 2023-02-13: 4 mg via INTRAVENOUS
  Filled 2023-02-13: qty 1

## 2023-02-13 MED ORDER — PROPOFOL 10 MG/ML IV BOLUS
0.5000 mg/kg | Freq: Once | INTRAVENOUS | Status: DC
  Filled 2023-02-13: qty 20

## 2023-02-13 NOTE — ED Notes (Signed)
RT at bedside for procedural sedation. Amb bag at bedside with suction. ETCO2 monitor in place.

## 2023-02-13 NOTE — Discharge Instructions (Addendum)
Begin taking Percocet as prescribed as needed for pain.  Elevate your leg is much as possible for the next several days.  Ice for 20 minutes every 2 hours while awake for the next 2 days.  Follow-up with orthopedics in the next 1 to 2 days.  The contact information for Dr. Kathaleen Bury has been provided in this discharge summary for you to call and make these arrangements.

## 2023-02-13 NOTE — ED Notes (Signed)
Remaining 120 mg of Propofol wasted with Apolonio Schneiders, RN

## 2023-02-13 NOTE — ED Provider Notes (Signed)
[facility] Provider Note   CSN: [id number] Arrival date & time: 02/13/23  [id number]     History  Chief Complaint  Patient presents with   Fall   Ankle Injury   Laceration    Latoya [name] is a 47 y.o. female.  Patient is a 47 year old female presenting with complaints of a fall.  Patient tripped walking up a flight of stairs, then struck her face on the banister and twisted her ankle.  She has a small laceration to the outside of the lip and inside of the lower lip, but denies any loss of consciousness, headache, or neck pain.  Patient does not take any blood thinners.  She complains of pain to her left ankle and an inability to bear weight.  She was brought here by EMS.  The history is provided by the patient.  Fall This is a new problem. The current episode started less than 1 hour ago.  Ankle Injury  Laceration      Home Medications Prior to Admission medications   Medication Sig Start Date End Date Taking? Authorizing Provider  Biotin 1 MG CAPS     [provider]  Cyanocobalamin (B-12 PO) Take by mouth.    [provider]  KETOCONAZOLE, TOPICAL, 1 % SHAM Let shampoo sit for 3-5 minutes, then rinse Externally 2-3 times per week for 30 days 12/12/20 05/14/23  [provider]  Magnesium 100 MG CAPS     [provider]  Multiple Vitamin (MULTIVITAMIN) tablet Take 1 tablet by mouth daily.    [provider]  olmesartan (BENICAR) 20 MG tablet TAKE 1 TABLET BY MOUTH EVERY DAY 11/19/22   Janith Lima, MD  omeprazole (PRILOSEC) 20 MG capsule TAKE 1 CAPSULE BY MOUTH  DAILY 06/17/18   Fontaine, Belinda Block, MD  Rhubarb (ESTROVEN COMPLETE PO) Take by mouth.    [provider]  traZODone (DESYREL) 50 MG tablet TAKE 1 TABLET BY MOUTH EVERYDAY AT BEDTIME 11/19/22   Janith Lima, MD  triamterene-hydrochlorothiazide (DYAZIDE) 37.5-25 MG capsule TAKE 1 CAPSULE BY MOUTH EVERY DAY 11/14/22    Janith Lima, MD  zolmitriptan (ZOMIG) 5 MG tablet Take 1 tab po for migraine headache. Do not repeat for 24 hours. 01/16/22   Tamela Gammon, NP      Allergies    Codeine and Naltrexone    Review of Systems   Review of Systems  All other systems reviewed and are negative.   Physical Exam Updated Vital Signs Temp 98 F (36.7 C) (Oral)   Resp 18   Wt 99.8 kg   LMP 12/30/2005   BMI 35.51 kg/m  Physical Exam Vitals and nursing note reviewed.  Constitutional:      General: She is not in acute distress.    Appearance: Normal appearance. She is not ill-appearing.  HENT:     Head: Normocephalic and atraumatic.     Mouth/Throat:     Mouth: Mucous membranes are moist.     Comments: There is a 1 cm laceration noted to the inside of the lower lip.  There is also a punctate laceration noted to the outside of the lower lip.  This does not seem to represent a through and through laceration. Pulmonary:     Effort: Pulmonary effort is normal.  Musculoskeletal:     Comments: There is swelling and tenderness of the medial and lateral malleolus.  DP pulses are palpable and motor and sensation are intact  throughout the entire foot.  Skin:    General: Skin is warm and dry.  Neurological:     General: No focal deficit present.     Mental Status: She is alert and oriented to person, place, and time.     Cranial Nerves: No cranial nerve deficit.     Motor: No weakness.     ED Results / Procedures / Treatments   Labs (all labs ordered are listed, but only abnormal results are displayed) Labs Reviewed - No data to display  EKG None  Radiology No results found.  Procedures .Sedation  Date/Time: 02/13/2023 3:47 AM  Performed by: Veryl Speak, MD Authorized by: Veryl Speak, MD   Consent:    Consent obtained:  Verbal and written   Consent given by:  Patient   Risks discussed:  Allergic reaction, prolonged hypoxia resulting in organ damage, respiratory compromise  necessitating ventilatory assistance and intubation and prolonged sedation necessitating reversal   Alternatives discussed:  Analgesia without sedation Universal protocol:    Procedure explained and questions answered to patient or proxy's satisfaction: yes     Relevant documents present and verified: yes     Test results available: yes     Imaging studies available: yes     Required blood products, implants, devices, and special equipment available: yes     Site/side marked: yes     Immediately prior to procedure, a time out was called: yes     Patient identity confirmed:  Arm band and verbally with patient Indications:    Procedure performed:  Fracture reduction   Procedure necessitating sedation performed by:  Physician performing sedation Pre-sedation assessment:    Time since last food or drink:  4 hours   ASA classification: class 1 - normal, healthy patient     Mallampati score:  I - soft palate, uvula, fauces, pillars visible   Neck mobility: normal     Pre-sedation assessments completed and reviewed: airway patency, cardiovascular function, mental status, nausea/vomiting and respiratory function   Immediate pre-procedure details:    Reassessment: Patient reassessed immediately prior to procedure   Procedure details (see MAR for exact dosages):    Preoxygenation:  Nasal cannula   Sedation:  Propofol   Intended level of sedation: deep and moderate (conscious sedation)   Intra-procedure monitoring:  Blood pressure monitoring, continuous capnometry, frequent LOC assessments, continuous pulse oximetry and cardiac monitor   Intra-procedure events: none     Total Provider sedation time (minutes):  15 Post-procedure details:    Post-sedation assessment completed:  02/13/2023 3:49 AM   Attendance: Constant attendance by certified staff until patient recovered     Recovery: Patient returned to pre-procedure baseline     Post-sedation assessments completed and reviewed: airway patency  and cardiovascular function     Patient is stable for discharge or admission: yes     Procedure completion:  Tolerated well, no immediate complications Reduction of fracture  Date/Time: 02/13/2023 3:50 AM  Performed by: Veryl Speak, MD Authorized by: Veryl Speak, MD  Consent: Verbal consent obtained. Written consent obtained. Risks and benefits: risks, benefits and alternatives were discussed Consent given by: patient Patient understanding: patient states understanding of the procedure being performed Patient consent: the patient's understanding of the procedure matches consent given Procedure consent: procedure consent matches procedure scheduled Relevant documents: relevant documents present and verified Test results: test results available and properly labeled Site marked: the operative site was marked Imaging studies: imaging studies available Required items: required blood products, implants, devices,  and special equipment available Patient identity confirmed: verbally with patient, arm band and hospital-assigned identification number Time out: Immediately prior to procedure a "time out" was called to verify the correct patient, procedure, equipment, support staff and site/side marked as required. Local anesthesia used: no  Anesthesia: Local anesthesia used: no  Sedation: Patient sedated: yes Sedation type: moderate (conscious) sedation Sedatives: propofol Analgesia: morphine Sedation start date/time: 02/13/2023 3:30 AM Sedation end date/time: 02/13/2023 3:50 AM  Patient tolerance: patient tolerated the procedure well with no immediate complications       Medications Ordered in ED Medications  morphine (PF) 4 MG/ML injection 4 mg (has no administration in time range)    ED Course/ Medical Decision Making/ A&P  Patient brought by EMS after a fall that occurred on the stairs at home.  She injured her left ankle and also lacerated her lower lip.  She arrives here  with stable vital signs and is clinically well-appearing.  She does have some swelling noted of the left ankle and tenderness to both the medial and lateral malleolus.  DP pulses are palpable bilaterally.  X-rays obtained showing a bimalleolar fracture of the left ankle with some displacement.  This finding was discussed with Dr. Kathaleen Bury from orthopedics.  He is recommending reduction and splinting and follow-up in the office.  Conscious sedation was performed using propofol and the ankle was reduced and splinted with a posterior splint/stirrup.    Final Clinical Impression(s) / ED Diagnoses Final diagnoses:  None    Rx / DC Orders ED Discharge Orders     None         Veryl Speak, MD 02/13/23 0430

## 2023-02-13 NOTE — ED Triage Notes (Signed)
Pt in from home via [facility] after tripping while going upstairs, injuring L ankle and mouth striking onto stair bannister. Pt has thru and thru laceration underneath bottom lip, bleeding controlled. L ankle swollen, +2 pedal pulse. No thinners, no LOC, pt does report drinking wine tonight

## 2023-02-13 NOTE — Sedation Documentation (Signed)
Unable to assess pain due to sedation for procedure

## 2023-02-13 NOTE — ED Notes (Addendum)
Patient to be taken home by Leroy Sea (spouse) who has been at bedside throughout procedure.  Patient taken by wheelchair to car where Herbie Baltimore was driving.   Patient assisted into passenger seat, stated and spouse confirmed that she had crutches at home and spouse drove off with patient.

## 2023-02-14 ENCOUNTER — Telehealth: Payer: Self-pay

## 2023-02-14 NOTE — Transitions of Care (Post Inpatient/ED Visit) (Signed)
   02/14/2023  Name: Latoya [name] MRN: [id number] DOB: 10/06/1976  Today's TOC FU Call Status: Today's TOC FU Call Status:: Successful TOC FU Call Competed TOC FU Call Complete Date: 02/14/23  Transition Care Management Follow-up Telephone Call Date of Discharge: 02/13/23 Discharge Facility: [facility] Type of Discharge: Emergency Department Reason for ED Visit: Orthopedic Conditions Orthopedic/Injury Diagnosis: Fracture How have you been since you were released from the hospital?: Same Any questions or concerns?: No  Items Reviewed: Did you receive and understand the discharge instructions provided?: Yes Medications obtained and verified?: Yes (Medications Reviewed) Any new allergies since your discharge?: No Dietary orders reviewed?: NA Do you have support at home?: Yes People in Home: spouse  Home Care and Equipment/Supplies: [facility] Ordered?: NA Any new equipment or medical supplies ordered?: NA  Functional Questionnaire: Do you need assistance with bathing/showering or dressing?: No Do you need assistance with meal preparation?: No Do you need assistance with eating?: No Do you have difficulty maintaining continence: No Do you need assistance with getting out of bed/getting out of a chair/moving?: No Do you have difficulty managing or taking your medications?: No  Folllow up appointments reviewed: PCP Follow-up appointment confirmed?: NA Specialist Hospital Follow-up appointment confirmed?: Yes Date of Specialist follow-up appointment?: 02/13/23 Follow-Up Specialty Provider:: [facility] Do you need transportation to your follow-up appointment?: No Do you understand care options if your condition(s) worsen?: Yes-patient verbalized understanding    SIGNATURE Juanda Crumble, [place] Nurse Health Advisor Direct Dial [phone]

## 2023-02-15 ENCOUNTER — Other Ambulatory Visit: Payer: Self-pay | Admitting: Internal Medicine

## 2023-02-15 DIAGNOSIS — F5101 Primary insomnia: Secondary | ICD-10-CM

## 2023-02-15 DIAGNOSIS — I10 Essential (primary) hypertension: Secondary | ICD-10-CM

## 2023-02-19 ENCOUNTER — Ambulatory Visit (HOSPITAL_BASED_OUTPATIENT_CLINIC_OR_DEPARTMENT_OTHER)
Admission: RE | Admit: 2023-02-19 | Payer: BC Managed Care – PPO | Source: Home / Self Care | Admitting: Orthopaedic Surgery

## 2023-02-19 ENCOUNTER — Encounter (HOSPITAL_BASED_OUTPATIENT_CLINIC_OR_DEPARTMENT_OTHER): Admission: RE | Payer: Self-pay | Source: Home / Self Care

## 2023-02-19 DIAGNOSIS — X58XXXA Exposure to other specified factors, initial encounter: Secondary | ICD-10-CM | POA: Diagnosis not present

## 2023-02-19 DIAGNOSIS — S82842A Displaced bimalleolar fracture of left lower leg, initial encounter for closed fracture: Secondary | ICD-10-CM | POA: Diagnosis not present

## 2023-02-19 DIAGNOSIS — S82841A Displaced bimalleolar fracture of right lower leg, initial encounter for closed fracture: Secondary | ICD-10-CM | POA: Diagnosis not present

## 2023-02-19 DIAGNOSIS — S93431A Sprain of tibiofibular ligament of right ankle, initial encounter: Secondary | ICD-10-CM | POA: Diagnosis not present

## 2023-02-19 DIAGNOSIS — G8918 Other acute postprocedural pain: Secondary | ICD-10-CM | POA: Diagnosis not present

## 2023-02-19 DIAGNOSIS — S93432A Sprain of tibiofibular ligament of left ankle, initial encounter: Secondary | ICD-10-CM | POA: Diagnosis not present

## 2023-02-19 DIAGNOSIS — Y999 Unspecified external cause status: Secondary | ICD-10-CM | POA: Diagnosis not present

## 2023-02-19 SURGERY — OPEN REDUCTION INTERNAL FIXATION (ORIF) ANKLE FRACTURE
Anesthesia: Choice | Site: Ankle | Laterality: Left

## 2023-02-20 ENCOUNTER — Telehealth: Payer: Self-pay

## 2023-02-20 NOTE — Telephone Encounter (Signed)
Pt calling to report Lt ankle surgery yesterday and is currently in a soft cast until 02/27/2023.   Pt has bone density scheduled for 02/25/2023 and is wondering if it will be ok for her to postpone the scan until she is recovered or if we think the DEXA can be performed without causing her too much pain. She states that she experiences difficulty/pain getting around town.   Please advise.

## 2023-02-20 NOTE — Telephone Encounter (Signed)
Tillie Rung D, CMA She will call back to reschedule

## 2023-02-20 NOTE — Telephone Encounter (Signed)
Okay to reschedule for when she is more comfortable with mobility.

## 2023-02-20 NOTE — Telephone Encounter (Signed)
Msg sent to appt desk to advice pt that ok to r/s per TW and to r/s her appt.

## 2023-02-23 ENCOUNTER — Other Ambulatory Visit: Payer: Self-pay | Admitting: Internal Medicine

## 2023-02-23 DIAGNOSIS — I10 Essential (primary) hypertension: Secondary | ICD-10-CM

## 2023-02-27 DIAGNOSIS — S82842D Displaced bimalleolar fracture of left lower leg, subsequent encounter for closed fracture with routine healing: Secondary | ICD-10-CM | POA: Diagnosis not present

## 2023-03-13 DIAGNOSIS — S82842A Displaced bimalleolar fracture of left lower leg, initial encounter for closed fracture: Secondary | ICD-10-CM | POA: Diagnosis not present

## 2023-03-20 DIAGNOSIS — S82842D Displaced bimalleolar fracture of left lower leg, subsequent encounter for closed fracture with routine healing: Secondary | ICD-10-CM | POA: Diagnosis not present

## 2023-04-01 DIAGNOSIS — S82842A Displaced bimalleolar fracture of left lower leg, initial encounter for closed fracture: Secondary | ICD-10-CM | POA: Diagnosis not present

## 2023-04-01 NOTE — Telephone Encounter (Signed)
Latoya [name] w/ pt, states she is doing well after ankle surgery. However, just got cast off today and is in a boot but states still not able to walk on it for 3 more weeks but is happy to schedule DEXA now but is looking for it to be at the earliest in ~mid may. Msg sent to appt desk.

## 2023-04-02 NOTE — Telephone Encounter (Signed)
Pt scheduled for DEXA on 05/13/2023. Will close encounter.

## 2023-04-22 DIAGNOSIS — S82842D Displaced bimalleolar fracture of left lower leg, subsequent encounter for closed fracture with routine healing: Secondary | ICD-10-CM | POA: Diagnosis not present

## 2023-05-07 DIAGNOSIS — S82842A Displaced bimalleolar fracture of left lower leg, initial encounter for closed fracture: Secondary | ICD-10-CM | POA: Diagnosis not present

## 2023-05-08 ENCOUNTER — Emergency Department (HOSPITAL_COMMUNITY): Payer: BC Managed Care – PPO

## 2023-05-08 ENCOUNTER — Encounter (HOSPITAL_COMMUNITY): Payer: Self-pay

## 2023-05-08 ENCOUNTER — Inpatient Hospital Stay (HOSPITAL_COMMUNITY)
Admission: EM | Admit: 2023-05-08 | Discharge: 2023-05-11 | DRG: 440 | Disposition: A | Discharge status: Home / Self Care | Payer: BC Managed Care – PPO | Attending: Internal Medicine | Admitting: Internal Medicine

## 2023-05-08 ENCOUNTER — Other Ambulatory Visit: Payer: Self-pay

## 2023-05-08 DIAGNOSIS — F17290 Nicotine dependence, other tobacco product, uncomplicated: Secondary | ICD-10-CM | POA: Diagnosis present

## 2023-05-08 DIAGNOSIS — R0789 Other chest pain: Secondary | ICD-10-CM | POA: Diagnosis not present

## 2023-05-08 DIAGNOSIS — K219 Gastro-esophageal reflux disease without esophagitis: Secondary | ICD-10-CM | POA: Diagnosis present

## 2023-05-08 DIAGNOSIS — Z6835 Body mass index (BMI) 35.0-35.9, adult: Secondary | ICD-10-CM

## 2023-05-08 DIAGNOSIS — K8500 Idiopathic acute pancreatitis without necrosis or infection: Secondary | ICD-10-CM | POA: Diagnosis not present

## 2023-05-08 DIAGNOSIS — Z8249 Family history of ischemic heart disease and other diseases of the circulatory system: Secondary | ICD-10-CM | POA: Diagnosis not present

## 2023-05-08 DIAGNOSIS — I700 Atherosclerosis of aorta: Secondary | ICD-10-CM | POA: Diagnosis not present

## 2023-05-08 DIAGNOSIS — Z833 Family history of diabetes mellitus: Secondary | ICD-10-CM

## 2023-05-08 DIAGNOSIS — R1111 Vomiting without nausea: Secondary | ICD-10-CM | POA: Diagnosis not present

## 2023-05-08 DIAGNOSIS — E669 Obesity, unspecified: Secondary | ICD-10-CM | POA: Diagnosis present

## 2023-05-08 DIAGNOSIS — R000 Tachycardia, unspecified: Secondary | ICD-10-CM | POA: Diagnosis not present

## 2023-05-08 DIAGNOSIS — Z9884 Bariatric surgery status: Secondary | ICD-10-CM

## 2023-05-08 DIAGNOSIS — R109 Unspecified abdominal pain: Secondary | ICD-10-CM | POA: Diagnosis not present

## 2023-05-08 DIAGNOSIS — F17210 Nicotine dependence, cigarettes, uncomplicated: Secondary | ICD-10-CM | POA: Diagnosis present

## 2023-05-08 DIAGNOSIS — I10 Essential (primary) hypertension: Secondary | ICD-10-CM | POA: Diagnosis present

## 2023-05-08 DIAGNOSIS — K852 Alcohol induced acute pancreatitis without necrosis or infection: Secondary | ICD-10-CM | POA: Diagnosis not present

## 2023-05-08 DIAGNOSIS — Z79899 Other long term (current) drug therapy: Secondary | ICD-10-CM | POA: Diagnosis not present

## 2023-05-08 DIAGNOSIS — Z823 Family history of stroke: Secondary | ICD-10-CM | POA: Diagnosis not present

## 2023-05-08 DIAGNOSIS — E876 Hypokalemia: Secondary | ICD-10-CM | POA: Diagnosis not present

## 2023-05-08 DIAGNOSIS — K859 Acute pancreatitis without necrosis or infection, unspecified: Secondary | ICD-10-CM | POA: Diagnosis not present

## 2023-05-08 DIAGNOSIS — F1010 Alcohol abuse, uncomplicated: Secondary | ICD-10-CM | POA: Diagnosis present

## 2023-05-08 LAB — COMPREHENSIVE METABOLIC PANEL
ALT: 21 U/L (ref 0–44)
AST: 22 U/L (ref 15–41)
Albumin: 4.1 g/dL (ref 3.5–5.0)
Alkaline Phosphatase: 62 U/L (ref 38–126)
Anion gap: 15 (ref 5–15)
BUN: 24 mg/dL — ABNORMAL HIGH (ref 6–20)
CO2: 21 mmol/L — ABNORMAL LOW (ref 22–32)
Calcium: 8.7 mg/dL — ABNORMAL LOW (ref 8.9–10.3)
Chloride: 96 mmol/L — ABNORMAL LOW (ref 98–111)
Creatinine, Ser: 0.94 mg/dL (ref 0.44–1.00)
GFR, Estimated: >60 mL/min (ref >60)
Glucose, Bld: 144 mg/dL — ABNORMAL HIGH (ref 70–99)
Potassium: 3.5 mmol/L (ref 3.5–5.1)
Sodium: 132 mmol/L — ABNORMAL LOW (ref 135–145)
Total Bilirubin: 0.4 mg/dL (ref 0.3–1.2)
Total Protein: 7.2 g/dL (ref 6.5–8.1)

## 2023-05-08 LAB — URINALYSIS, W/ REFLEX TO CULTURE (INFECTION SUSPECTED)
Bilirubin Urine: NEGATIVE
Glucose, UA: NEGATIVE mg/dL
Hgb urine dipstick: NEGATIVE
Ketones, ur: 20 mg/dL — AB
Nitrite: NEGATIVE
Protein, ur: NEGATIVE mg/dL
Specific Gravity, Urine: 1.017 (ref 1.005–1.030)
pH: 5 (ref 5.0–8.0)

## 2023-05-08 LAB — HIV ANTIBODY (ROUTINE TESTING W REFLEX): HIV Screen 4th Generation wRfx: NONREACTIVE

## 2023-05-08 LAB — CBC WITH DIFFERENTIAL/PLATELET
Abs Immature Granulocytes: 0.05 10*3/uL (ref 0.00–0.07)
Basophils Absolute: 0 10*3/uL (ref 0.0–0.1)
Basophils Relative: 0 %
Eosinophils Absolute: 0 10*3/uL (ref 0.0–0.5)
Eosinophils Relative: 0 %
HCT: 34.8 % — ABNORMAL LOW (ref 36.0–46.0)
Hemoglobin: 11.4 g/dL — ABNORMAL LOW (ref 12.0–15.0)
Immature Granulocytes: 0 %
Lymphocytes Relative: 8 %
Lymphs Abs: 1 10*3/uL (ref 0.7–4.0)
MCH: 32.1 pg (ref 26.0–34.0)
MCHC: 32.8 g/dL (ref 30.0–36.0)
MCV: 98 fL (ref 80.0–100.0)
Monocytes Absolute: 0.4 10*3/uL (ref 0.1–1.0)
Monocytes Relative: 3 %
Neutro Abs: 10.6 10*3/uL — ABNORMAL HIGH (ref 1.7–7.7)
Neutrophils Relative %: 89 %
Platelets: 261 10*3/uL (ref 150–400)
RBC: 3.55 MIL/uL — ABNORMAL LOW (ref 3.87–5.11)
RDW: 13.3 % (ref 11.5–15.5)
WBC: 12 10*3/uL — ABNORMAL HIGH (ref 4.0–10.5)
nRBC: 0 % (ref 0.0–0.2)

## 2023-05-08 LAB — CBC
HCT: 31.3 % — ABNORMAL LOW (ref 36.0–46.0)
Hemoglobin: 10.3 g/dL — ABNORMAL LOW (ref 12.0–15.0)
MCH: 32.2 pg (ref 26.0–34.0)
MCHC: 32.9 g/dL (ref 30.0–36.0)
MCV: 97.8 fL (ref 80.0–100.0)
Platelets: 232 10*3/uL (ref 150–400)
RBC: 3.2 MIL/uL — ABNORMAL LOW (ref 3.87–5.11)
RDW: 13.4 % (ref 11.5–15.5)
WBC: 9.9 10*3/uL (ref 4.0–10.5)
nRBC: 0 % (ref 0.0–0.2)

## 2023-05-08 LAB — CREATININE, SERUM
Creatinine, Ser: 0.81 mg/dL (ref 0.44–1.00)
GFR, Estimated: >60 mL/min (ref >60)

## 2023-05-08 LAB — LIPASE, BLOOD: Lipase: 2093 U/L — ABNORMAL HIGH (ref 11–51)

## 2023-05-08 MED ORDER — LORAZEPAM 1 MG PO TABS: 1.0000 mg | ORAL_TABLET | ORAL | Status: AC | PRN

## 2023-05-08 MED ORDER — ACETAMINOPHEN 325 MG PO TABS
650.0000 mg | ORAL_TABLET | Freq: Four times a day (QID) | ORAL | Status: DC | PRN
  Administered 2023-05-08: 650 mg via ORAL
  Filled 2023-05-08: qty 2

## 2023-05-08 MED ORDER — ONDANSETRON HCL 4 MG/2ML IJ SOLN: 4.0000 mg | Freq: Four times a day (QID) | INTRAMUSCULAR | Status: DC | PRN

## 2023-05-08 MED ORDER — LOPERAMIDE HCL 2 MG PO CAPS: 2.0000 mg | ORAL_CAPSULE | ORAL | Status: AC | PRN

## 2023-05-08 MED ORDER — ASPIRIN 81 MG PO TBEC
81.0000 mg | DELAYED_RELEASE_TABLET | Freq: Two times a day (BID) | ORAL | Status: DC
  Administered 2023-05-08 – 2023-05-11 (×6): 81 mg via ORAL
  Filled 2023-05-08 (×6): qty 1

## 2023-05-08 MED ORDER — TRAZODONE HCL 100 MG PO TABS
50.0000 mg | ORAL_TABLET | Freq: Every evening | ORAL | Status: DC | PRN
  Filled 2023-05-08: qty 1

## 2023-05-08 MED ORDER — ACETAMINOPHEN 650 MG RE SUPP: 650.0000 mg | Freq: Four times a day (QID) | RECTAL | Status: DC | PRN

## 2023-05-08 MED ORDER — ALBUTEROL SULFATE (2.5 MG/3ML) 0.083% IN NEBU: 2.5000 mg | INHALATION_SOLUTION | RESPIRATORY_TRACT | Status: DC | PRN

## 2023-05-08 MED ORDER — MORPHINE SULFATE (PF) 4 MG/ML IV SOLN
4.0000 mg | Freq: Once | INTRAVENOUS | Status: AC
  Administered 2023-05-08: 4 mg via INTRAVENOUS
  Filled 2023-05-08: qty 1

## 2023-05-08 MED ORDER — CHLORDIAZEPOXIDE HCL 5 MG PO CAPS
25.0000 mg | ORAL_CAPSULE | Freq: Three times a day (TID) | ORAL | Status: AC
  Filled 2023-05-08: qty 5

## 2023-05-08 MED ORDER — PANTOPRAZOLE SODIUM 40 MG PO TBEC
40.0000 mg | DELAYED_RELEASE_TABLET | Freq: Every day | ORAL | Status: DC
  Administered 2023-05-08 – 2023-05-11 (×4): 40 mg via ORAL
  Filled 2023-05-08 (×4): qty 1

## 2023-05-08 MED ORDER — HYDRALAZINE HCL 20 MG/ML IJ SOLN: 5.0000 mg | INTRAMUSCULAR | Status: DC | PRN

## 2023-05-08 MED ORDER — METOCLOPRAMIDE HCL 5 MG/ML IJ SOLN
10.0000 mg | Freq: Once | INTRAMUSCULAR | Status: AC
  Administered 2023-05-08: 10 mg via INTRAVENOUS
  Filled 2023-05-08: qty 2

## 2023-05-08 MED ORDER — LORAZEPAM 2 MG/ML IJ SOLN: 1.0000 mg | INTRAMUSCULAR | Status: AC | PRN

## 2023-05-08 MED ORDER — CHLORDIAZEPOXIDE HCL 5 MG PO CAPS
25.0000 mg | ORAL_CAPSULE | ORAL | Status: DC
  Filled 2023-05-08: qty 5

## 2023-05-08 MED ORDER — HYDROXYZINE HCL 25 MG PO TABS
25.0000 mg | ORAL_TABLET | Freq: Four times a day (QID) | ORAL | Status: AC | PRN
  Administered 2023-05-08 – 2023-05-09 (×2): 25 mg via ORAL
  Filled 2023-05-08 (×2): qty 1

## 2023-05-08 MED ORDER — POLYETHYLENE GLYCOL 3350 17 G PO PACK: 17.0000 g | PACK | Freq: Every day | ORAL | Status: DC | PRN

## 2023-05-08 MED ORDER — THIAMINE MONONITRATE 100 MG PO TABS
100.0000 mg | ORAL_TABLET | Freq: Every day | ORAL | Status: DC
  Administered 2023-05-08 – 2023-05-11 (×4): 100 mg via ORAL
  Filled 2023-05-08 (×4): qty 1

## 2023-05-08 MED ORDER — OXYCODONE-ACETAMINOPHEN 5-325 MG PO TABS
1.0000 | ORAL_TABLET | Freq: Four times a day (QID) | ORAL | Status: DC | PRN
  Administered 2023-05-08 – 2023-05-09 (×2): 2 via ORAL
  Administered 2023-05-09 – 2023-05-10 (×3): 1 via ORAL
  Filled 2023-05-08: qty 2
  Filled 2023-05-08 (×3): qty 1
  Filled 2023-05-08: qty 2

## 2023-05-08 MED ORDER — ONDANSETRON 4 MG PO TBDP: 4.0000 mg | ORAL_TABLET | Freq: Four times a day (QID) | ORAL | Status: DC | PRN

## 2023-05-08 MED ORDER — IOHEXOL 300 MG/ML  SOLN
100.0000 mL | Freq: Once | INTRAMUSCULAR | Status: AC | PRN
  Administered 2023-05-08: 100 mL via INTRAVENOUS

## 2023-05-08 MED ORDER — CHLORDIAZEPOXIDE HCL 5 MG PO CAPS: 25.0000 mg | ORAL_CAPSULE | Freq: Every day | ORAL | Status: DC

## 2023-05-08 MED ORDER — IRBESARTAN 150 MG PO TABS
150.0000 mg | ORAL_TABLET | Freq: Every day | ORAL | Status: DC
  Administered 2023-05-08 – 2023-05-11 (×4): 150 mg via ORAL
  Filled 2023-05-08 (×4): qty 1

## 2023-05-08 MED ORDER — HYDROMORPHONE HCL 1 MG/ML IJ SOLN: 0.5000 mg | INTRAMUSCULAR | Status: DC | PRN

## 2023-05-08 MED ORDER — FOLIC ACID 1 MG PO TABS
1.0000 mg | ORAL_TABLET | Freq: Every day | ORAL | Status: DC
  Administered 2023-05-08 – 2023-05-11 (×4): 1 mg via ORAL
  Filled 2023-05-08 (×4): qty 1

## 2023-05-08 MED ORDER — THIAMINE HCL 100 MG/ML IJ SOLN: 100.0000 mg | Freq: Every day | INTRAMUSCULAR | Status: DC

## 2023-05-08 MED ORDER — DOCUSATE SODIUM 100 MG PO CAPS
100.0000 mg | ORAL_CAPSULE | Freq: Two times a day (BID) | ORAL | Status: DC
  Administered 2023-05-08 – 2023-05-11 (×6): 100 mg via ORAL
  Filled 2023-05-08 (×6): qty 1

## 2023-05-08 MED ORDER — ADULT MULTIVITAMIN W/MINERALS CH
1.0000 | ORAL_TABLET | Freq: Every day | ORAL | Status: DC
  Administered 2023-05-08 – 2023-05-11 (×4): 1 via ORAL
  Filled 2023-05-08 (×4): qty 1

## 2023-05-08 MED ORDER — CHLORDIAZEPOXIDE HCL 5 MG PO CAPS
25.0000 mg | ORAL_CAPSULE | Freq: Four times a day (QID) | ORAL | Status: AC
  Filled 2023-05-08 (×2): qty 5

## 2023-05-08 MED ORDER — ONDANSETRON HCL 4 MG PO TABS: 4.0000 mg | ORAL_TABLET | Freq: Four times a day (QID) | ORAL | Status: DC | PRN

## 2023-05-08 MED ORDER — LACTATED RINGERS IV SOLN: INTRAVENOUS | Status: DC

## 2023-05-08 MED ORDER — ENOXAPARIN SODIUM 40 MG/0.4ML IJ SOSY
40.0000 mg | PREFILLED_SYRINGE | INTRAMUSCULAR | Status: DC
  Administered 2023-05-08 – 2023-05-10 (×3): 40 mg via SUBCUTANEOUS
  Filled 2023-05-08 (×3): qty 0.4

## 2023-05-08 MED ORDER — LACTATED RINGERS IV BOLUS
1000.0000 mL | Freq: Once | INTRAVENOUS | Status: AC
  Administered 2023-05-08: 1000 mL via INTRAVENOUS

## 2023-05-08 NOTE — H&P (Signed)
History and Physical  Latoya [name] ZOX:[id number] DOB: 1976-03-27 DOA: 05/08/2023  PCP: [name], MD   Chief Complaint: abdominal pain   HPI: Latoya [name] is a 47 y.o. female with medical history significant for endometriosis, PAT, history of gastric bypass in 2020 and moderate alcohol abuse presents with sudden onset abdominal pain that started late last evening.  Severe pain in the epigastric area, with associated vomiting. No radiation. Has never had pain like this before and has never had pancreatitis before. No bowel movements recently.  Has not had any flatus since the pain started.  Denies hematemesis, no cough congestion or fever.  Patient is a daily drinker, last drink yesterday evening, typically drinks 3-4 glasses of wine a night and didn't drink more than this last night.  ED Course: Patient presented via EMS, vital signs demonstrate tachycardia heart rate 117, blood pressure elevated 142/87.  Lab work was performed, shows WBC 12,000, hemoglobin 12, sodium 132, renal function normal, lipase 2093.  LFTs including bilirubin unremarkable.  She had CT scan of the abdomen and pelvis which shows evidence of acute pancreatitis as noted below.  Review of Systems: Please see HPI for pertinent positives and negatives. A complete 10 system review of systems are otherwise negative.  Past Medical History:  Diagnosis Date   Anemia    iron deficiency   Bleeding gastric ulcer 11/29/2002   post gastric bypass   Endometriosis 12/30/1998   GERD (gastroesophageal reflux disease)    History of blood transfusion 11/29/2002   "for bleeding ulcer"   Hypertension    Migraines    "used to have often; now maybe 1-2/yr" (07/15/12)   PAT (paroxysmal atrial tachycardia)    occ if stressed,no tests in 17 yrs   PONV (postoperative nausea and vomiting)    UTI (lower urinary tract infection)    "more when I was younger; not bad since hysterectomy"   Past Surgical History:  Procedure  Laterality Date   ANTERIOR LUMBAR FUSION  07/15/12   abdominal exposure   ANTERIOR LUMBAR FUSION  07/15/2012   Procedure: ANTERIOR LUMBAR FUSION 1 LEVEL;  Surgeon: [name], MD;  Location: [facility] OR;  Service: Orthopedics;  Laterality: N/A;  total disc replacement versus ALIF L4-5   CARPAL TUNNEL RELEASE  2001?   right   CESAREAN SECTION  1996   PELVIC LAPAROSCOPY  2000   endometriosis   ROUX-EN-Y GASTRIC BYPASS  2003   SALPINGOOPHORECTOMY  2007; 2009   left; right   TUBAL LIGATION  03   VAGINAL HYSTERECTOMY  2007   [facility]    Social History:  reports that she has been smoking e-cigarettes and cigarettes. She started smoking about 4 years ago. She has a 5.00 pack-year smoking history. She has never used smokeless tobacco. She reports current alcohol use of about 45.0 standard drinks of alcohol per week. She reports that she does not use drugs.   Allergies  Allergen Reactions   Codeine Itching   Naltrexone Nausea Only    Family History  Problem Relation Age of Onset   Diabetes Mother    Hypertension Mother    Stroke Mother        mini   Lung cancer Mother    Heart failure Mother    Hypertension Father    Cancer Father        Liver   Breast cancer Maternal Aunt 56     Prior to Admission medications   Medication Sig Start Date End Date Taking? Authorizing Provider  aspirin EC 81 MG tablet Take 1 tablet by mouth 2 (two) times daily. 02/13/23  Yes [provider]  Biotin 1 MG CAPS     [provider]  Cyanocobalamin (B-12 PO) Take by mouth.    [provider]  KETOCONAZOLE, TOPICAL, 1 % SHAM Let shampoo sit for 3-5 minutes, then rinse Externally 2-3 times per week for 30 days 12/12/20 05/14/23  [provider]  Magnesium 100 MG CAPS     [provider]  Multiple Vitamin (MULTIVITAMIN) tablet Take 1 tablet by mouth daily.    [provider]  olmesartan (BENICAR) 20 MG tablet TAKE 1 TABLET BY MOUTH EVERY DAY 02/24/23   [name], MD  omeprazole (PRILOSEC) 20 MG capsule TAKE 1 CAPSULE BY MOUTH  DAILY 06/17/18   Fontaine, Nadyne Coombes, MD  oxyCODONE-acetaminophen (PERCOCET) 5-325 MG tablet Take 1-2 tablets by mouth every 6 (six) hours as needed. 02/13/23   Geoffery Lyons, MD  Rhubarb (ESTROVEN COMPLETE PO) Take by mouth.    [provider]  traZODone (DESYREL) 50 MG tablet TAKE 1 TABLET BY MOUTH EVERYDAY AT BEDTIME 02/15/23   [name], MD  triamterene-hydrochlorothiazide (DYAZIDE) 37.5-25 MG capsule TAKE 1 CAPSULE BY MOUTH EVERY DAY 02/24/23   [name], MD  zolmitriptan (ZOMIG) 5 MG tablet Take 1 tab po for migraine headache. Do not repeat for 24 hours. 01/16/22   Olivia Mackie, NP    Physical Exam: BP (!) 142/87   Pulse (!) 109   Temp 98.2 F (36.8 C) (Oral)   Resp 17   Ht 5\' 6"  (1.676 m)   Wt 99.8 kg   LMP 12/30/2005   SpO2 97%   BMI 35.51 kg/m   General:  Alert, oriented, calm, in no acute distress  Eyes: EOMI, clear conjuctivae, white sclerea Neck: supple, no masses, trachea mildline  Cardiovascular: RRR, no murmurs or rubs, no peripheral edema  Respiratory: clear to auscultation bilaterally, no wheezes, no crackles  Abdomen: soft, epigastrum tender, nondistended, normal bowel tones heard  Skin: dry, no rashes  Musculoskeletal: no joint effusions, normal range of motion, left leg in boot Psychiatric: appropriate affect, normal speech  Neurologic: extraocular muscles intact, clear speech, moving all extremities with intact sensorium          Labs on Admission:  Basic Metabolic Panel: Recent Labs  Lab 05/08/23 0712  NA 132*  K 3.5  CL 96*  CO2 21*  GLUCOSE 144*  BUN 24*  CREATININE 0.94  CALCIUM 8.7*   Liver Function Tests: Recent Labs  Lab 05/08/23 0712  AST 22  ALT 21  ALKPHOS 62  BILITOT 0.4  PROT 7.2  ALBUMIN 4.1   Recent Labs  Lab 05/08/23 0712  LIPASE 2,093*   No results for input(s): "AMMONIA" in the last 168 hours. CBC: Recent Labs  Lab  05/08/23 0712  WBC 12.0*  NEUTROABS 10.6*  HGB 11.4*  HCT 34.8*  MCV 98.0  PLT 261   Cardiac Enzymes: No results for input(s): "CKTOTAL", "CKMB", "CKMBINDEX", "TROPONINI" in the last 168 hours.  BNP (last 3 results) No results for input(s): "BNP" in the last 8760 hours.  ProBNP (last 3 results) No results for input(s): "PROBNP" in the last 8760 hours.  CBG: No results for input(s): "GLUCAP" in the last 168 hours.  Radiological Exams on Admission: CT ABDOMEN PELVIS W CONTRAST  Result Date: 05/08/2023 CLINICAL DATA:  Abdominal pain EXAM: CT ABDOMEN AND PELVIS WITH CONTRAST TECHNIQUE: Multidetector CT imaging  of the abdomen and pelvis was performed using the standard protocol following bolus administration of intravenous contrast. RADIATION DOSE REDUCTION: This exam was performed according to the departmental dose-optimization program which includes automated exposure control, adjustment of the mA and/or kV according to patient size and/or use of iterative reconstruction technique. CONTRAST:  OMNIPAQUE IOHEXOL 300 MG/ML  SOLN COMPARISON:  None Available. FINDINGS: Lower chest: There is some linear opacity lung bases likely scar or atelectasis. No pleural effusion. Hepatobiliary: Fatty liver infiltration identified. There are some areas of sparing along the gallbladder fossa. The gallbladder is nondilated. No space-occupying liver lesion. Patent portal vein. Pancreas: Preserved pancreatic parenchyma. No obvious mass. However there is significant stranding and fluid around the pancreas including towards the pancreatic groove. Fluid tracks along the anterior perinephric spaces and the pericolic gutters. Spleen: Normal in size without focal abnormality. Adrenals/Urinary Tract: The adrenal glands are preserved. Kidneys are unremarkable without collecting system dilatation or enhancing lesion. The ureters have normal course and caliber down to the bladder. Preserved contours of the urinary  bladder. Stomach/Bowel: There is a redundant course to the sigmoid colon extending into the upper midabdomen. Scattered colonic stool. Normal appendix. Surgical changes from gastric bypass small bowel is nondilated. Vascular/Lymphatic: Aortic atherosclerosis. No enlarged abdominal or pelvic lymph nodes. Reproductive: Status post hysterectomy. No adnexal masses. Other: No free air.  Small fat containing umbilical hernia. Musculoskeletal: Mild degenerative changes of the spine and pelvis. There is significant streak artifact related to the disc hardware at L4-5. IMPRESSION: There is fluid and stranding around the pancreas particularly towards the pancreatic groove and fluid tracking along the anterior perinephric spaces and pericolic gutters. Please correlate for clinical evidence of pancreatitis. The pancreas itself is enhancing. No rim enhancing fluid collections. Severe fatty liver infiltration. Surgical changes from gastric bypass.  Normal appendix Electronically Signed   By: Karen Kays M.D.   On: 05/08/2023 10:48    Assessment/Plan  Acute pancreatitis without necrosis or infection, unclear etiology - could be due to alcohol consumption, medication (HCTZ) or other. Does not appear to be related to gallstones or biliary disease. - inpatient admission - NPO - IVF - pain and nausea meds PRN - check triglycerides and fasting lipids in AM  Active Problems:   GERD (gastroesophageal reflux disease) - continue PPI   Primary hypertension - continue home ARB, hold HCTZ   Alcohol use disorder, mild, abuse - will place on Librium taper and ativan per CIWA protocol. Recent bimalleolar fracture - s/p repair, on ASA BID will continue   Obesity (BMI 30-39.9) - BMI 35, complicates all aspects of care  DVT prophylaxis: Lovenox     Code Status: Full Code  Consults called: None  Admission status: The appropriate patient status for this patient is INPATIENT. Inpatient status is judged to be reasonable and  necessary in order to provide the required intensity of service to ensure the patient's safety. The patient's presenting symptoms, physical exam findings, and initial radiographic and laboratory data in the context of their chronic comorbidities is felt to place them at high risk for further clinical deterioration. Furthermore, it is not anticipated that the patient will be medically stable for discharge from the hospital within 2 midnights of admission.    I certify that at the point of admission it is my clinical judgment that the patient will require inpatient hospital care spanning beyond 2 midnights from the point of admission due to high intensity of service, high risk for further deterioration and high frequency  of surveillance required  Time spent: 53 minutes  Kessler Kopinski Sharlette Dense MD [facility] Pager [phone]  If 7PM-7AM, please contact night-coverage [email or web address] Password [facility]  05/08/2023, 12:18 PM

## 2023-05-08 NOTE — ED Triage Notes (Signed)
Pt presents to ED for evaluation of mid-sternal chest pain since last night relieved by burping.

## 2023-05-08 NOTE — ED Provider Notes (Signed)
[place] EMERGENCY DEPARTMENT AT [facility] Provider Note   CSN: [id number] Arrival date & time: 05/08/23  5366     History  Chief Complaint  Patient presents with   Chest Pain    Latoya [name] is a 47 y.o. female.  Patient is a 47 year old female with a history of endometriosis, PAT, anemia, status post gastric bypass in 2003 with bleeding gastric ulcers post bypass who is presenting today with sudden onset of abdominal pain 6 to 7 hours ago.  She was feeling fine before that when she suddenly developed the severe pain in her epigastric area.  She has had vomiting related to the pain, occasional burping and no bowel movements.  She has not had any flatus since the pain started.  She denies any hematemesis.  She has no cough, congestion or fever.  She does report taking a deep breath seems to make the pain worse.  She has not taken anything for the pain and denies history of similar symptoms.  The last time she had a complication with her gastric bypass was in 2004.  She has done relatively well with it.  She denies any urinary symptoms and denies history of any cardiac disease or lung disease.  Patient came by ambulance and was given aspirin and route.  The history is provided by the patient and medical records.  Chest Pain      Home Medications Prior to Admission medications   Medication Sig Start Date End Date Taking? Authorizing Provider  Biotin 1 MG CAPS     [provider]  Cyanocobalamin (B-12 PO) Take by mouth.    [provider]  KETOCONAZOLE, TOPICAL, 1 % SHAM Let shampoo sit for 3-5 minutes, then rinse Externally 2-3 times per week for 30 days 12/12/20 05/14/23  [provider]  Magnesium 100 MG CAPS     [provider]  Multiple Vitamin (MULTIVITAMIN) tablet Take 1 tablet by mouth daily.    [provider]  olmesartan (BENICAR) 20 MG tablet TAKE 1 TABLET BY MOUTH EVERY DAY 02/24/23   [name], MD   omeprazole (PRILOSEC) 20 MG capsule TAKE 1 CAPSULE BY MOUTH  DAILY 06/17/18   Fontaine, Nadyne Coombes, MD  oxyCODONE-acetaminophen (PERCOCET) 5-325 MG tablet Take 1-2 tablets by mouth every 6 (six) hours as needed. 02/13/23   Geoffery Lyons, MD  Rhubarb (ESTROVEN COMPLETE PO) Take by mouth.    [provider]  traZODone (DESYREL) 50 MG tablet TAKE 1 TABLET BY MOUTH EVERYDAY AT BEDTIME 02/15/23   [name], MD  triamterene-hydrochlorothiazide (DYAZIDE) 37.5-25 MG capsule TAKE 1 CAPSULE BY MOUTH EVERY DAY 02/24/23   [name], MD  zolmitriptan (ZOMIG) 5 MG tablet Take 1 tab po for migraine headache. Do not repeat for 24 hours. 01/16/22   Olivia Mackie, NP      Allergies    Codeine and Naltrexone    Review of Systems   Review of Systems  Cardiovascular:  Positive for chest pain.    Physical Exam Updated Vital Signs BP 121/67   Pulse (!) 116   Temp 98.2 F (36.8 C) (Oral)   Resp 17   Ht 5\' 6"  (1.676 m)   Wt 99.8 kg   LMP 12/30/2005   SpO2 97%   BMI 35.51 kg/m  Physical Exam Vitals and nursing note reviewed.  Constitutional:      General: She is in acute distress.     Appearance: She is well-developed.  Comments: Appears uncomfortable  HENT:     Head: Normocephalic and atraumatic.     Mouth/Throat:     Mouth: Mucous membranes are dry.  Eyes:     Pupils: Pupils are equal, round, and reactive to light.  Cardiovascular:     Rate and Rhythm: Regular rhythm. Tachycardia present.     Heart sounds: Normal heart sounds. No murmur heard.    No friction rub.  Pulmonary:     Effort: Pulmonary effort is normal.     Breath sounds: Normal breath sounds. No wheezing or rales.  Abdominal:     General: Bowel sounds are absent. There is no distension.     Palpations: Abdomen is soft.     Tenderness: There is abdominal tenderness in the epigastric area and periumbilical area. There is rebound. There is no guarding.  Musculoskeletal:        General: No tenderness.  Normal range of motion.     Right lower leg: No edema.     Left lower leg: No edema.     Comments: No edema  Skin:    General: Skin is warm and dry.     Findings: No rash.  Neurological:     Mental Status: She is alert and oriented to person, place, and time. Mental status is at baseline.     Cranial Nerves: No cranial nerve deficit.  Psychiatric:        Behavior: Behavior normal.     ED Results / Procedures / Treatments   Labs (all labs ordered are listed, but only abnormal results are displayed) Labs Reviewed  COMPREHENSIVE METABOLIC PANEL - Abnormal; Notable for the following components:      Result Value   Sodium 132 (*)    Chloride 96 (*)    CO2 21 (*)    Glucose, Bld 144 (*)    BUN 24 (*)    Calcium 8.7 (*)    All other components within normal limits  CBC WITH DIFFERENTIAL/PLATELET - Abnormal; Notable for the following components:   WBC 12.0 (*)    RBC 3.55 (*)    Hemoglobin 11.4 (*)    HCT 34.8 (*)    Neutro Abs 10.6 (*)    All other components within normal limits  LIPASE, BLOOD - Abnormal; Notable for the following components:   Lipase 2,093 (*)    All other components within normal limits  URINALYSIS, W/ REFLEX TO CULTURE (INFECTION SUSPECTED) - Abnormal; Notable for the following components:   Ketones, ur 20 (*)    Leukocytes,Ua TRACE (*)    Bacteria, UA MANY (*)    All other components within normal limits    EKG EKG Interpretation  Date/Time:  Thursday May 08 2023 07:16:20 EDT Ventricular Rate:  110 PR Interval:  190 QRS Duration: 85 QT Interval:  321 QTC Calculation: 435 R Axis:   49 Text Interpretation: Sinus tachycardia RSR' in V1 or V2, probably normal variant No significant change since last tracing Confirmed by Gwyneth Sprout ([id number]) on 05/08/2023 7:18:52 AM  Radiology CT ABDOMEN PELVIS W CONTRAST  Result Date: 05/08/2023 CLINICAL DATA:  Abdominal pain EXAM: CT ABDOMEN AND PELVIS WITH CONTRAST TECHNIQUE: Multidetector CT imaging of the  abdomen and pelvis was performed using the standard protocol following bolus administration of intravenous contrast. RADIATION DOSE REDUCTION: This exam was performed according to the departmental dose-optimization program which includes automated exposure control, adjustment of the mA and/or kV according to patient size and/or use of iterative reconstruction technique. CONTRAST:  OMNIPAQUE IOHEXOL 300 MG/ML  SOLN COMPARISON:  None Available. FINDINGS: Lower chest: There is some linear opacity lung bases likely scar or atelectasis. No pleural effusion. Hepatobiliary: Fatty liver infiltration identified. There are some areas of sparing along the gallbladder fossa. The gallbladder is nondilated. No space-occupying liver lesion. Patent portal vein. Pancreas: Preserved pancreatic parenchyma. No obvious mass. However there is significant stranding and fluid around the pancreas including towards the pancreatic groove. Fluid tracks along the anterior perinephric spaces and the pericolic gutters. Spleen: Normal in size without focal abnormality. Adrenals/Urinary Tract: The adrenal glands are preserved. Kidneys are unremarkable without collecting system dilatation or enhancing lesion. The ureters have normal course and caliber down to the bladder. Preserved contours of the urinary bladder. Stomach/Bowel: There is a redundant course to the sigmoid colon extending into the upper midabdomen. Scattered colonic stool. Normal appendix. Surgical changes from gastric bypass small bowel is nondilated. Vascular/Lymphatic: Aortic atherosclerosis. No enlarged abdominal or pelvic lymph nodes. Reproductive: Status post hysterectomy. No adnexal masses. Other: No free air.  Small fat containing umbilical hernia. Musculoskeletal: Mild degenerative changes of the spine and pelvis. There is significant streak artifact related to the disc hardware at L4-5. IMPRESSION: There is fluid and stranding around the pancreas particularly towards  the pancreatic groove and fluid tracking along the anterior perinephric spaces and pericolic gutters. Please correlate for clinical evidence of pancreatitis. The pancreas itself is enhancing. No rim enhancing fluid collections. Severe fatty liver infiltration. Surgical changes from gastric bypass.  Normal appendix Electronically Signed   By: Karen Kays M.D.   On: 05/08/2023 10:48    Procedures Procedures    Medications Ordered in ED Medications  lactated ringers infusion (has no administration in time range)  morphine (PF) 4 MG/ML injection 4 mg (has no administration in time range)  morphine (PF) 4 MG/ML injection 4 mg (4 mg Intravenous Given 05/08/23 0727)  metoCLOPramide (REGLAN) injection 10 mg (10 mg Intravenous Given 05/08/23 0727)  lactated ringers bolus 1,000 mL (1,000 mLs Intravenous New Bag/Given 05/08/23 0728)  iohexol (OMNIPAQUE) 300 MG/ML solution 100 mL (100 mLs Intravenous Contrast Given 05/08/23 0944)    ED Course/ Medical Decision Making/ A&P                             Medical Decision Making Amount and/or Complexity of Data Reviewed Labs: ordered. Decision-making details documented in ED Course. Radiology: ordered and independent interpretation performed. Decision-making details documented in ED Course. ECG/medicine tests: ordered and independent interpretation performed. Decision-making details documented in ED Course.  Risk Prescription drug management.   Pt with multiple medical problems and comorbidities and presenting today with a complaint that caries a high risk for morbidity and mortality.  Here today with sudden onset of abdominal pain.  Concern for complication of gastric bypass such as internal hernia versus cholecystitis versus pancreatitis versus hepatitis.  Lower suspicion for appendicitis or diverticulitis.  Also concern for perforation or obstruction.  Patient appears uncomfortable on exam with given pain and nausea control.  Labs and imaging are pending.   Lower suspicion at this time for cardiac or respiratory cause of her symptoms such as PE, MI, dissection.  11:29 AM I independently interpreted patient's EKG and labs.  EKG without acute findings, UA without acute findings, CMP with normal creatinine and potassium, sodium mildly decreased at 132, LFTs and total bilirubin is normal, CBC with mild leukocytosis of 12, stable hemoglobin of 11, lipase is elevated  today at 2093.  I have independently visualized and interpreted pt's images today.  CT abdomen pelvis with evidence of inflammation around the pancreas, no evidence of gallstones.  Radiology reports fluid and stranding around the pancreas especially the pancreatic groove and perinephric spaces and pericolic gutters consistent with pancreatitis.  No evidence of pseudocyst.  Normal appendix, gallbladder and prior surgery from gastric bypass.  Speaking with the patient she does drink alcohol almost daily and did have 3 to 4 glasses of wine yesterday but also she is on hydrochlorothiazide.  Suspect this is most likely the cause of the pancreatitis.  Initially pain was improved with morphine however on repeat evaluation the pain is starting to come back.  Will continue IV fluids and pain control.  Will admit for further care.  Findings discussed with the patient and her husband who was present at bedside and they are comfortable with this plan.          Final Clinical Impression(s) / ED Diagnoses Final diagnoses:  Acute pancreatitis, unspecified complication status, unspecified pancreatitis type    Rx / DC Orders ED Discharge Orders     None         Gwyneth Sprout, MD 05/08/23 1129

## 2023-05-09 LAB — CBC
HCT: 30.1 % — ABNORMAL LOW (ref 36.0–46.0)
Hemoglobin: 9.9 g/dL — ABNORMAL LOW (ref 12.0–15.0)
MCH: 32.7 pg (ref 26.0–34.0)
MCHC: 32.9 g/dL (ref 30.0–36.0)
MCV: 99.3 fL (ref 80.0–100.0)
Platelets: 204 10*3/uL (ref 150–400)
RBC: 3.03 MIL/uL — ABNORMAL LOW (ref 3.87–5.11)
RDW: 13.8 % (ref 11.5–15.5)
WBC: 11 10*3/uL — ABNORMAL HIGH (ref 4.0–10.5)
nRBC: 0 % (ref 0.0–0.2)

## 2023-05-09 LAB — COMPREHENSIVE METABOLIC PANEL
ALT: 17 U/L (ref 0–44)
AST: 19 U/L (ref 15–41)
Albumin: 3.6 g/dL (ref 3.5–5.0)
Alkaline Phosphatase: 54 U/L (ref 38–126)
Anion gap: 10 (ref 5–15)
BUN: 13 mg/dL (ref 6–20)
CO2: 26 mmol/L (ref 22–32)
Calcium: 8.6 mg/dL — ABNORMAL LOW (ref 8.9–10.3)
Chloride: 99 mmol/L (ref 98–111)
Creatinine, Ser: 0.83 mg/dL (ref 0.44–1.00)
GFR, Estimated: >60 mL/min (ref >60)
Glucose, Bld: 121 mg/dL — ABNORMAL HIGH (ref 70–99)
Potassium: 3.4 mmol/L — ABNORMAL LOW (ref 3.5–5.1)
Sodium: 135 mmol/L (ref 135–145)
Total Bilirubin: 0.6 mg/dL (ref 0.3–1.2)
Total Protein: 6.1 g/dL — ABNORMAL LOW (ref 6.5–8.1)

## 2023-05-09 LAB — LIPID PANEL
Cholesterol: 176 mg/dL (ref 0–200)
HDL: 53 mg/dL (ref >40)
LDL Cholesterol: 99 mg/dL (ref 0–99)
Total CHOL/HDL Ratio: 3.3 RATIO
Triglycerides: 120 mg/dL (ref <150)
VLDL: 24 mg/dL (ref 0–40)

## 2023-05-09 LAB — TRIGLYCERIDES: Triglycerides: 131 mg/dL (ref <150)

## 2023-05-09 LAB — LIPASE, BLOOD: Lipase: 624 U/L — ABNORMAL HIGH (ref 11–51)

## 2023-05-09 MED ORDER — POTASSIUM CHLORIDE CRYS ER 20 MEQ PO TBCR
40.0000 meq | EXTENDED_RELEASE_TABLET | Freq: Once | ORAL | Status: AC
  Administered 2023-05-09: 40 meq via ORAL
  Filled 2023-05-09: qty 2

## 2023-05-09 NOTE — TOC Initial Note (Signed)
Transition of Care [facility]) - Initial/Assessment Note    Patient Details  Name: Latoya [name] MRN: [id number] Date of Birth: 1976-07-08  Transition of Care [facility]) CM/SW Contact:    Otelia Santee, LCSW Phone Number: 05/09/2023, 11:31 AM  Clinical Narrative:                 [facility] consulted for SA resources. Met with pt at bedside. Pt declines all resources. CSW encouraged pt to reach out to CSW if she decides she would like resources.  No further TOC needs identified. TOC signing off at this time, please reconsult should further needs arise.   Expected Discharge Plan: Home/Self Care Barriers to Discharge: No Barriers Identified   Patient Goals and CMS Choice Patient states their goals for this hospitalization and ongoing recovery are:: To go home CMS Medicare.gov Compare Post Acute Care list provided to:: Patient Choice offered to / list presented to : Patient [place] ownership interest in [facility].provided to:: Patient    Expected Discharge Plan and Services In-house Referral: Clinical Social Work Discharge Planning Services: NA Post Acute Care Choice: NA Living arrangements for the past 2 months: Apartment                 DME Arranged: N/A DME Agency: NA                  Prior Living Arrangements/Services Living arrangements for the past 2 months: Apartment Lives with:: Significant Other Patient language and need for interpreter reviewed:: Yes Do you feel safe going back to the place where you live?: Yes      Need for Family Participation in Patient Care: No (Comment) Care giver support system in place?: No (comment)   Criminal Activity/Legal Involvement Pertinent to Current Situation/Hospitalization: No - Comment as needed  Activities of Daily Living Home Assistive Devices/Equipment: Eyeglasses, Environmental consultant (specify type), Shower chair without back ADL Screening (condition at time of admission) Patient's cognitive ability adequate to safely complete  daily activities?: Yes Is the patient deaf or have difficulty hearing?: No Does the patient have difficulty seeing, even when wearing glasses/contacts?: No Does the patient have difficulty concentrating, remembering, or making decisions?: No Patient able to express need for assistance with ADLs?: Yes Does the patient have difficulty dressing or bathing?: No Independently performs ADLs?: Yes (appropriate for developmental age) Does the patient have difficulty walking or climbing stairs?: Yes Weakness of Legs: Left Weakness of Arms/Hands: None  Permission Sought/Granted   Permission granted to share information with : No              Emotional Assessment Appearance:: Appears stated age Attitude/Demeanor/Rapport: Engaged Affect (typically observed): Pleasant Orientation: : Oriented to Self, Oriented to Place, Oriented to  Time, Oriented to Situation Alcohol / Substance Use: Alcohol Use Psych Involvement: No (comment)  Admission diagnosis:  Acute pancreatitis [K85.90] Acute pancreatitis, unspecified complication status, unspecified pancreatitis type [K85.90] Patient Active Problem List   Diagnosis Date Noted   Acute pancreatitis without necrosis or infection, unspecified 05/08/2023   Acute pancreatitis 05/08/2023   Obesity (BMI 30-39.9) 05/08/2023   Deficiency anemia 12/05/2022   Alcohol use disorder, mild, abuse 08/07/2022   Primary insomnia 06/06/2022   Colon cancer screening 03/10/2022   Alcoholic hepatitis without ascites 03/07/2022   Encounter for general adult medical examination with abnormal findings 03/06/2022   Primary hypertension 03/06/2022   GERD (gastroesophageal reflux disease) 10/11/2014   Migraines 10/11/2014   PCP:  [name], MD Pharmacy:  [id number] [place], [place] - [address] [place] [address] Phone: [phone] Fax: [phone]     Social Determinants of Health (SDOH) Social History: SDOH  Screenings   Food Insecurity: No Food Insecurity (05/08/2023)  Housing: Low Risk  (05/08/2023)  Transportation Needs: No Transportation Needs (05/08/2023)  Utilities: Not At Risk (05/08/2023)  Depression (PHQ2-9): Low Risk  (03/06/2022)  Tobacco Use: High Risk (05/08/2023)   SDOH Interventions:     Readmission Risk Interventions    05/09/2023   11:30 AM  Readmission Risk Prevention Plan  Transportation Screening Complete  PCP or Specialist Appt within 5-7 Days Complete  Home Care Screening Complete  Medication Review (RN CM) Complete

## 2023-05-09 NOTE — Progress Notes (Signed)
PROGRESS NOTE  Latoya [name] ZOX:[id number] DOB: 11/18/1976 DOA: 05/08/2023 PCP: [name], MD   LOS: 1 day   Brief Narrative / Interim history: 47 year old with gastric bypass in 2020, moderate alcohol use presents with abdominal pain of sudden onset that started the evening prior to admission.  It was located in the epigastric area, with associated vomiting.  She reports a recent trip to [place] where she drank more than usual.  She returned 2 weeks ago.  In the ED she was found to have acute pancreatitis.  LFT/bilirubin/gallbladder imaging was unremarkable.  Subjective / 24h Interval events: Is a little bit better this morning.  Less abdominal pain, no nausea.  She was hungry last night.  Assesement and Plan: Principal Problem:   Acute pancreatitis without necrosis or infection, unspecified Active Problems:   GERD (gastroesophageal reflux disease)   Primary hypertension   Alcohol use disorder, mild, abuse   Acute pancreatitis   Obesity (BMI 30-39.9)  Principal problem Acute pancreatitis, uncomplicated -possibly due to slight increase in EtOH consumption given [place] trip.  Lipid panel unremarkable, triglycerides normal at 120.  She is improving today, continue IV fluids, allow clear liquid diet.  Slowly advance based on symptoms.  Active problems Essential hypertension -continue irbesartan, hold HCTZ  Alcohol use disorder, mild -continue Librium, however she does not appear to need it  Obesity, class I  -BMI 35, she would benefit from ongoing weight loss  Hypokalemia -replace potassium and continue to monitor  Scheduled Meds:  aspirin EC  81 mg Oral BID   chlordiazePOXIDE  25 mg Oral QID   Followed by   chlordiazePOXIDE  25 mg Oral TID   Followed by   [name] ON 05/10/2023] chlordiazePOXIDE  25 mg Oral BH-qamhs   Followed by   [name] ON 05/11/2023] chlordiazePOXIDE  25 mg Oral Daily   docusate sodium  100 mg Oral BID   enoxaparin (LOVENOX) injection  40 mg  Subcutaneous Q24H   folic acid  1 mg Oral Daily   irbesartan  150 mg Oral Daily   multivitamin with minerals  1 tablet Oral Daily   pantoprazole  40 mg Oral Daily   thiamine  100 mg Oral Daily   Or   thiamine  100 mg Intravenous Daily   Continuous Infusions:  lactated ringers 150 mL/hr at 05/09/23 0408   PRN Meds:.acetaminophen **OR** acetaminophen, albuterol, hydrALAZINE, HYDROmorphone (DILAUDID) injection, hydrOXYzine, loperamide, LORazepam **OR** LORazepam, ondansetron **OR** ondansetron (ZOFRAN) IV, oxyCODONE-acetaminophen, polyethylene glycol, traZODone  Current Outpatient Medications  Medication Instructions   aspirin EC 81 mg, Oral, 2 times daily   Biotin 1 mg, Oral, Daily   Cyanocobalamin (B-12 PO) 1 capsule, Oral, Daily   HYDROcodone-acetaminophen (NORCO/VICODIN) 5-325 MG tablet 1 tablet, Oral, As needed   KETOCONAZOLE, TOPICAL, 1 % SHAM 1 Application, Apply externally, 2 times weekly   Magnesium 100 mg, Oral, Daily   methocarbamol (ROBAXIN) 750 mg, Oral, As needed   Multiple Vitamin (MULTIVITAMIN) tablet 1 tablet, Oral, Daily   olmesartan (BENICAR) 20 MG tablet TAKE 1 TABLET BY MOUTH EVERY DAY   omeprazole (PRILOSEC) 20 MG capsule TAKE 1 CAPSULE BY MOUTH  DAILY   oxyCODONE-acetaminophen (PERCOCET) 5-325 MG tablet 1-2 tablets, Oral, Every 6 hours PRN   traZODone (DESYREL) 50 MG tablet TAKE 1 TABLET BY MOUTH EVERYDAY AT BEDTIME   triamterene-hydrochlorothiazide (DYAZIDE) 37.5-25 MG capsule Oral, Daily   zolmitriptan (ZOMIG) 5 MG tablet Take 1 tab po for migraine headache. Do not repeat for 24 hours.  Diet Orders (From admission, onward)     Start     Ordered   05/09/23 0849  Diet clear liquid Room service appropriate? Yes; Fluid consistency: Thin  Diet effective now       Question Answer Comment  Room service appropriate? Yes   Fluid consistency: Thin      05/09/23 0848            DVT prophylaxis: enoxaparin (LOVENOX) injection 40 mg Start: 05/08/23  1800 SCDs Start: 05/08/23 1217   Lab Results  Component Value Date   PLT 204 05/09/2023      Code Status: Full Code  Family Communication: no family at bedside   Status is: Inpatient  Remains inpatient appropriate because: severity of illnes  Level of care: Med-Surg  Consultants:  none  Objective: Vitals:   05/08/23 1419 05/08/23 1817 05/08/23 2125 05/09/23 0829  BP: 137/79 121/75 (!) 142/72 (!) 146/88  Pulse: (!) 109 98 (!) 107 95  Resp: 18 16 18 20   Temp: 98.2 F (36.8 C) 98.5 F (36.9 C) 98.8 F (37.1 C) 98.2 F (36.8 C)  TempSrc: Rectal Oral Oral Oral  SpO2: 100% 97% 96% 96%  Weight:      Height:        Intake/Output Summary (Last 24 hours) at 05/09/2023 0953 Last data filed at 05/09/2023 0408 Gross per 24 hour  Intake 3421.02 ml  Output --  Net 3421.02 ml   Wt Readings from Last 3 Encounters:  05/08/23 99.8 kg  02/13/23 99.8 kg  01/21/23 99.8 kg    Examination:  Constitutional: NAD Eyes: no scleral icterus ENMT: Mucous membranes are moist.  Neck: normal, supple Respiratory: clear to auscultation bilaterally, no wheezing, no crackles. Normal respiratory effort.  Cardiovascular: Regular rate and rhythm, no murmurs / rubs / gallops. No LE edema.  Abdomen: Mild epigastric tenderness without guarding or rebound Musculoskeletal: no clubbing / cyanosis.   Data Reviewed: I have independently reviewed following labs and imaging studies   CBC Recent Labs  Lab 05/08/23 0712 05/08/23 1320 05/09/23 0557  WBC 12.0* 9.9 11.0*  HGB 11.4* 10.3* 9.9*  HCT 34.8* 31.3* 30.1*  PLT 261 232 204  MCV 98.0 97.8 99.3  MCH 32.1 32.2 32.7  MCHC 32.8 32.9 32.9  RDW 13.3 13.4 13.8  LYMPHSABS 1.0  --   --   MONOABS 0.4  --   --   EOSABS 0.0  --   --   BASOSABS 0.0  --   --     Recent Labs  Lab 05/08/23 0712 05/08/23 1320 05/09/23 0557  NA 132*  --  135  K 3.5  --  3.4*  CL 96*  --  99  CO2 21*  --  26  GLUCOSE 144*  --  121*  BUN 24*  --  13   CREATININE 0.94 0.81 0.83  CALCIUM 8.7*  --  8.6*  AST 22  --  19  ALT 21  --  17  ALKPHOS 62  --  54  BILITOT 0.4  --  0.6  ALBUMIN 4.1  --  3.6    ------------------------------------------------------------------------------------------------------------------ Recent Labs    05/09/23 0557 05/09/23 0559  CHOL  --  176  HDL  --  53  LDLCALC  --  99  TRIG 131 120  CHOLHDL  --  3.3    No results found for: "HGBA1C" ------------------------------------------------------------------------------------------------------------------ No results for input(s): "TSH", "T4TOTAL", "T3FREE", "THYROIDAB" in the last 72 hours.  Invalid input(s): "FREET3"  Cardiac Enzymes No results for input(s): "CKMB", "TROPONINI", "MYOGLOBIN" in the last 168 hours.  Invalid input(s): "CK" ------------------------------------------------------------------------------------------------------------------ No results found for: "BNP"  CBG: No results for input(s): "GLUCAP" in the last 168 hours.  No results found for this or any previous visit (from the past 240 hour(s)).   Radiology Studies: CT ABDOMEN PELVIS W CONTRAST  Result Date: 05/08/2023 CLINICAL DATA:  Abdominal pain EXAM: CT ABDOMEN AND PELVIS WITH CONTRAST TECHNIQUE: Multidetector CT imaging of the abdomen and pelvis was performed using the standard protocol following bolus administration of intravenous contrast. RADIATION DOSE REDUCTION: This exam was performed according to the departmental dose-optimization program which includes automated exposure control, adjustment of the mA and/or kV according to patient size and/or use of iterative reconstruction technique. CONTRAST:  OMNIPAQUE IOHEXOL 300 MG/ML  SOLN COMPARISON:  None Available. FINDINGS: Lower chest: There is some linear opacity lung bases likely scar or atelectasis. No pleural effusion. Hepatobiliary: Fatty liver infiltration identified. There are some areas of sparing along the  gallbladder fossa. The gallbladder is nondilated. No space-occupying liver lesion. Patent portal vein. Pancreas: Preserved pancreatic parenchyma. No obvious mass. However there is significant stranding and fluid around the pancreas including towards the pancreatic groove. Fluid tracks along the anterior perinephric spaces and the pericolic gutters. Spleen: Normal in size without focal abnormality. Adrenals/Urinary Tract: The adrenal glands are preserved. Kidneys are unremarkable without collecting system dilatation or enhancing lesion. The ureters have normal course and caliber down to the bladder. Preserved contours of the urinary bladder. Stomach/Bowel: There is a redundant course to the sigmoid colon extending into the upper midabdomen. Scattered colonic stool. Normal appendix. Surgical changes from gastric bypass small bowel is nondilated. Vascular/Lymphatic: Aortic atherosclerosis. No enlarged abdominal or pelvic lymph nodes. Reproductive: Status post hysterectomy. No adnexal masses. Other: No free air.  Small fat containing umbilical hernia. Musculoskeletal: Mild degenerative changes of the spine and pelvis. There is significant streak artifact related to the disc hardware at L4-5. IMPRESSION: There is fluid and stranding around the pancreas particularly towards the pancreatic groove and fluid tracking along the anterior perinephric spaces and pericolic gutters. Please correlate for clinical evidence of pancreatitis. The pancreas itself is enhancing. No rim enhancing fluid collections. Severe fatty liver infiltration. Surgical changes from gastric bypass.  Normal appendix Electronically Signed   By: Karen Kays M.D.   On: 05/08/2023 10:48     Pamella Pert, MD, PhD [facility]  Between 7 am - 7 pm I am available, please contact me via Amion (for emergencies) or Securechat (non urgent messages)  Between 7 pm - 7 am I am not available, please contact night coverage MD/APP via Amion

## 2023-05-09 NOTE — Progress Notes (Signed)
Mobility Specialist - Progress Note   05/09/23 0931  Mobility  Activity Ambulated with assistance in hallway  Level of Assistance Standby assist, set-up cues, supervision of patient - no hands on  Assistive Device Front wheel walker  Distance Ambulated (ft) 300 ft  Range of Motion/Exercises Active  RLE Weight Bearing NWB  Activity Response Tolerated well  Mobility Referral Yes  $Mobility charge 1 Mobility  Mobility Specialist Start Time (ACUTE ONLY) 0920  Mobility Specialist Stop Time (ACUTE ONLY) 0932  Mobility Specialist Time Calculation (min) (ACUTE ONLY) 12 min   Pt received in bed and agreed to mobility, was standby for bed mobility and the entirety of the session. Pt took one standing rest break, leaned against the wall. Pt returned to bed with all needs met.  Marilynne Halsted Mobility Specialist

## 2023-05-10 LAB — COMPREHENSIVE METABOLIC PANEL
ALT: 12 U/L (ref 0–44)
AST: 12 U/L — ABNORMAL LOW (ref 15–41)
Albumin: 3 g/dL — ABNORMAL LOW (ref 3.5–5.0)
Alkaline Phosphatase: 52 U/L (ref 38–126)
Anion gap: 8 (ref 5–15)
BUN: 8 mg/dL (ref 6–20)
CO2: 29 mmol/L (ref 22–32)
Calcium: 8.4 mg/dL — ABNORMAL LOW (ref 8.9–10.3)
Chloride: 101 mmol/L (ref 98–111)
Creatinine, Ser: 0.81 mg/dL (ref 0.44–1.00)
GFR, Estimated: >60 mL/min (ref >60)
Glucose, Bld: 101 mg/dL — ABNORMAL HIGH (ref 70–99)
Potassium: 3.4 mmol/L — ABNORMAL LOW (ref 3.5–5.1)
Sodium: 138 mmol/L (ref 135–145)
Total Bilirubin: 0.7 mg/dL (ref 0.3–1.2)
Total Protein: 5.7 g/dL — ABNORMAL LOW (ref 6.5–8.1)

## 2023-05-10 LAB — CBC
HCT: 25.9 % — ABNORMAL LOW (ref 36.0–46.0)
Hemoglobin: 8.6 g/dL — ABNORMAL LOW (ref 12.0–15.0)
MCH: 33.7 pg (ref 26.0–34.0)
MCHC: 33.2 g/dL (ref 30.0–36.0)
MCV: 101.6 fL — ABNORMAL HIGH (ref 80.0–100.0)
Platelets: 174 10*3/uL (ref 150–400)
RBC: 2.55 MIL/uL — ABNORMAL LOW (ref 3.87–5.11)
RDW: 14 % (ref 11.5–15.5)
WBC: 10.4 10*3/uL (ref 4.0–10.5)
nRBC: 0 % (ref 0.0–0.2)

## 2023-05-10 LAB — MAGNESIUM: Magnesium: 1.7 mg/dL (ref 1.7–2.4)

## 2023-05-10 MED ORDER — POTASSIUM CHLORIDE CRYS ER 20 MEQ PO TBCR
40.0000 meq | EXTENDED_RELEASE_TABLET | Freq: Once | ORAL | Status: AC
  Administered 2023-05-10: 40 meq via ORAL
  Filled 2023-05-10: qty 2

## 2023-05-10 NOTE — Plan of Care (Signed)
Pt still having abd pain, managed with 1 dose of pain meds PO

## 2023-05-10 NOTE — Progress Notes (Signed)
PROGRESS NOTE  Latoya [name] ZOX:[id number] DOB: [birth year] DOA: 05/08/2023 PCP: [name], MD   LOS: 2 days   Brief Narrative / Interim history: 47 year old with gastric bypass in 2020, moderate alcohol use presents with abdominal pain of sudden onset that started the evening prior to admission.  It was located in the epigastric area, with associated vomiting.  She reports a recent trip to [place] where she drank more than usual.  She returned 2 weeks ago.  In the ED she was found to have acute pancreatitis.  LFT/bilirubin/gallbladder imaging was unremarkable.  Subjective / 24h Interval events: Overall feels better.  She did fair with clear liquids today, had abdominal pain in the morning but later in the day was able to tolerate clears better.  Still wants to remain on clears today  Assesement and Plan: Principal Problem:   Acute pancreatitis without necrosis or infection, unspecified Active Problems:   GERD (gastroesophageal reflux disease)   Primary hypertension   Alcohol use disorder, mild, abuse   Acute pancreatitis   Obesity (BMI 30-39.9)  Principal problem Acute pancreatitis, uncomplicated -possibly due to slight increase in EtOH consumption given [place] trip.  Lipid panel unremarkable, triglycerides normal at 120.  She is improving today, continue IV fluids, continue liquid diet.  Slowly advanced based on symptoms  Active problems Essential hypertension -continue irbesartan, hold HCTZ  Alcohol use disorder, mild -continue Librium, however she does not appear to need it  Obesity, class I  -BMI 35, she would benefit from ongoing weight loss  Hypokalemia -replace potassium again today, continue to monitor  Scheduled Meds:  aspirin EC  81 mg Oral BID   chlordiazePOXIDE  25 mg Oral TID   Followed by   chlordiazePOXIDE  25 mg Oral BH-qamhs   Followed by   [name] ON 05/11/2023] chlordiazePOXIDE  25 mg Oral Daily   docusate sodium  100 mg Oral BID    enoxaparin (LOVENOX) injection  40 mg Subcutaneous Q24H   folic acid  1 mg Oral Daily   irbesartan  150 mg Oral Daily   multivitamin with minerals  1 tablet Oral Daily   pantoprazole  40 mg Oral Daily   thiamine  100 mg Oral Daily   Or   thiamine  100 mg Intravenous Daily   Continuous Infusions:  lactated ringers 150 mL/hr at 05/10/23 0119   PRN Meds:.acetaminophen **OR** acetaminophen, albuterol, hydrALAZINE, HYDROmorphone (DILAUDID) injection, hydrOXYzine, loperamide, LORazepam **OR** LORazepam, ondansetron **OR** ondansetron (ZOFRAN) IV, oxyCODONE-acetaminophen, polyethylene glycol, traZODone  Current Outpatient Medications  Medication Instructions   aspirin EC 81 mg, Oral, 2 times daily   Biotin 1 mg, Oral, Daily   Cyanocobalamin (B-12 PO) 1 capsule, Oral, Daily   HYDROcodone-acetaminophen (NORCO/VICODIN) 5-325 MG tablet 1 tablet, Oral, As needed   KETOCONAZOLE, TOPICAL, 1 % SHAM 1 Application, Apply externally, 2 times weekly   Magnesium 100 mg, Oral, Daily   methocarbamol (ROBAXIN) 750 mg, Oral, As needed   Multiple Vitamin (MULTIVITAMIN) tablet 1 tablet, Oral, Daily   olmesartan (BENICAR) 20 MG tablet TAKE 1 TABLET BY MOUTH EVERY DAY   omeprazole (PRILOSEC) 20 MG capsule TAKE 1 CAPSULE BY MOUTH  DAILY   oxyCODONE-acetaminophen (PERCOCET) 5-325 MG tablet 1-2 tablets, Oral, Every 6 hours PRN   traZODone (DESYREL) 50 MG tablet TAKE 1 TABLET BY MOUTH EVERYDAY AT BEDTIME   triamterene-hydrochlorothiazide (DYAZIDE) 37.5-25 MG capsule Oral, Daily   zolmitriptan (ZOMIG) 5 MG tablet Take 1 tab po for migraine headache. Do not repeat for 24  hours.    Diet Orders (From admission, onward)     Start     Ordered   05/09/23 0849  Diet clear liquid Room service appropriate? Yes; Fluid consistency: Thin  Diet effective now       Question Answer Comment  Room service appropriate? Yes   Fluid consistency: Thin      05/09/23 0848            DVT prophylaxis: enoxaparin (LOVENOX)  injection 40 mg Start: 05/08/23 1800 SCDs Start: 05/08/23 1217   Lab Results  Component Value Date   PLT 174 05/10/2023      Code Status: Full Code  Family Communication: no family at bedside   Status is: Inpatient  Remains inpatient appropriate because: severity of illnes  Level of care: Med-Surg  Consultants:  none  Objective: Vitals:   05/09/23 0829 05/09/23 1259 05/09/23 1959 05/10/23 0551  BP: (!) 146/88 132/81 136/84 130/79  Pulse: 95 (!) 105 (!) 102 97  Resp: 20 18 16 16   Temp: 98.2 F (36.8 C) 98.5 F (36.9 C) 98.9 F (37.2 C) 98.3 F (36.8 C)  TempSrc: Oral Oral Oral Oral  SpO2: 96% 95% 97% 97%  Weight:      Height:       No intake or output data in the 24 hours ending 05/10/23 1037  Wt Readings from Last 3 Encounters:  05/08/23 99.8 kg  02/13/23 99.8 kg  01/21/23 99.8 kg    Examination:  Constitutional: NAD Eyes: lids and conjunctivae normal, no scleral icterus ENMT: mmm Neck: normal, supple Respiratory: clear to auscultation bilaterally, no wheezing, no crackles. Normal respiratory effort.  Cardiovascular: Regular rate and rhythm, no murmurs / rubs / gallops. No LE edema.  Data Reviewed: I have independently reviewed following labs and imaging studies   CBC Recent Labs  Lab 05/08/23 0712 05/08/23 1320 05/09/23 0557 05/10/23 0549  WBC 12.0* 9.9 11.0* 10.4  HGB 11.4* 10.3* 9.9* 8.6*  HCT 34.8* 31.3* 30.1* 25.9*  PLT 261 232 204 174  MCV 98.0 97.8 99.3 101.6*  MCH 32.1 32.2 32.7 33.7  MCHC 32.8 32.9 32.9 33.2  RDW 13.3 13.4 13.8 14.0  LYMPHSABS 1.0  --   --   --   MONOABS 0.4  --   --   --   EOSABS 0.0  --   --   --   BASOSABS 0.0  --   --   --      Recent Labs  Lab 05/08/23 0712 05/08/23 1320 05/09/23 0557 05/10/23 0549  NA 132*  --  135 138  K 3.5  --  3.4* 3.4*  CL 96*  --  99 101  CO2 21*  --  26 29  GLUCOSE 144*  --  121* 101*  BUN 24*  --  13 8  CREATININE 0.94 0.81 0.83 0.81  CALCIUM 8.7*  --  8.6* 8.4*  AST  22  --  19 12*  ALT 21  --  17 12  ALKPHOS 62  --  54 52  BILITOT 0.4  --  0.6 0.7  ALBUMIN 4.1  --  3.6 3.0*  MG  --   --   --  1.7     ------------------------------------------------------------------------------------------------------------------ Recent Labs    05/09/23 0557 05/09/23 0559  CHOL  --  176  HDL  --  53  LDLCALC  --  99  TRIG 131 120  CHOLHDL  --  3.3     No results found  for: "HGBA1C" ------------------------------------------------------------------------------------------------------------------ No results for input(s): "TSH", "T4TOTAL", "T3FREE", "THYROIDAB" in the last 72 hours.  Invalid input(s): "FREET3"  Cardiac Enzymes No results for input(s): "CKMB", "TROPONINI", "MYOGLOBIN" in the last 168 hours.  Invalid input(s): "CK" ------------------------------------------------------------------------------------------------------------------ No results found for: "BNP"  CBG: No results for input(s): "GLUCAP" in the last 168 hours.  No results found for this or any previous visit (from the past 240 hour(s)).   Radiology Studies: No results found.   Pamella Pert, MD, PhD [facility]  Between 7 am - 7 pm I am available, please contact me via Amion (for emergencies) or Securechat (non urgent messages)  Between 7 pm - 7 am I am not available, please contact night coverage MD/APP via Amion

## 2023-05-11 DIAGNOSIS — I10 Essential (primary) hypertension: Secondary | ICD-10-CM

## 2023-05-11 DIAGNOSIS — E669 Obesity, unspecified: Secondary | ICD-10-CM

## 2023-05-11 DIAGNOSIS — K852 Alcohol induced acute pancreatitis without necrosis or infection: Principal | ICD-10-CM

## 2023-05-11 LAB — BASIC METABOLIC PANEL
Anion gap: 10 (ref 5–15)
BUN: 7 mg/dL (ref 6–20)
CO2: 26 mmol/L (ref 22–32)
Calcium: 8.6 mg/dL — ABNORMAL LOW (ref 8.9–10.3)
Chloride: 103 mmol/L (ref 98–111)
Creatinine, Ser: 0.81 mg/dL (ref 0.44–1.00)
GFR, Estimated: >60 mL/min (ref >60)
Glucose, Bld: 100 mg/dL — ABNORMAL HIGH (ref 70–99)
Potassium: 3.8 mmol/L (ref 3.5–5.1)
Sodium: 139 mmol/L (ref 135–145)

## 2023-05-11 LAB — CBC
HCT: 25.6 % — ABNORMAL LOW (ref 36.0–46.0)
Hemoglobin: 8.3 g/dL — ABNORMAL LOW (ref 12.0–15.0)
MCH: 33.2 pg (ref 26.0–34.0)
MCHC: 32.4 g/dL (ref 30.0–36.0)
MCV: 102.4 fL — ABNORMAL HIGH (ref 80.0–100.0)
Platelets: 181 10*3/uL (ref 150–400)
RBC: 2.5 MIL/uL — ABNORMAL LOW (ref 3.87–5.11)
RDW: 13.9 % (ref 11.5–15.5)
WBC: 10.4 10*3/uL (ref 4.0–10.5)
nRBC: 0 % (ref 0.0–0.2)

## 2023-05-11 LAB — LIPASE, BLOOD: Lipase: 66 U/L — ABNORMAL HIGH (ref 11–51)

## 2023-05-11 LAB — MAGNESIUM: Magnesium: 1.7 mg/dL (ref 1.7–2.4)

## 2023-05-11 NOTE — Plan of Care (Signed)

## 2023-05-11 NOTE — Discharge Summary (Signed)
Physician Discharge Summary  Cove Igleheart ZOX:[id number] DOB: 1975-12-31 DOA: 05/08/2023  PCP: [name], MD  Admit date: 05/08/2023 Discharge date: 05/11/2023  Admitted From: home Disposition:  home  Recommendations for Outpatient Follow-up:  Follow up with PCP in 1-2 weeks  Home Health: none Equipment/Devices: none  Discharge Condition: stable CODE STATUS: Full code Diet Orders (From admission, onward)     Start     Ordered   05/11/23 0750  Diet Heart Room service appropriate? Yes; Fluid consistency: Thin  Diet effective now       Question Answer Comment  Room service appropriate? Yes   Fluid consistency: Thin      05/11/23 0749            HPI: Per admitting MD, 47 year old with gastric bypass in 2020, moderate alcohol use presents with abdominal pain of sudden onset that started the evening prior to admission. It was located in the epigastric area, with associated vomiting. She reports a recent trip to [place] where she drank more than usual. She returned 2 weeks ago. In the ED she was found to have acute pancreatitis. LFT/bilirubin/gallbladder imaging was unremarkable.   Hospital Course / Discharge diagnoses: Principal Problem:   Acute pancreatitis without necrosis or infection, unspecified Active Problems:   GERD (gastroesophageal reflux disease)   Primary hypertension   Alcohol use disorder, mild, abuse   Acute pancreatitis   Obesity (BMI 30-39.9)   Principal problem Acute pancreatitis, uncomplicated -possibly due to increase in EtOH consumption given [place] trip.  Lipid panel unremarkable, triglycerides normal at 120.  She was treated with n.p.o., IV fluids, symptomatic management.  She eventually recovered, her diet was slowly advanced, she is now tolerating a regular diet without worsening of her abdominal pain, no nausea or vomiting.  Lipase is improved to almost normal at 66.  Given improvement, tolerating diet, will be discharged home in  stable condition.  Recommend low-fat diet for now and complete alcohol cessation.   Active problems Essential hypertension -resume home medications upon discharge Alcohol use disorder, mild -no signs of withdrawal in the hospital  Obesity, class I  -status post bariatric surgery.  BMI 35, she would benefit from ongoing weight loss Anemia -dilutional in the setting of IV fluids. Hypokalemia -potassium replaced Left ankle fracture -in a boot, outpatient follow-up with orthopedic surgery  Sepsis ruled out   Discharge Instructions   Allergies as of 05/11/2023       Reactions   Codeine Itching   Naltrexone Nausea Only        Medication List     STOP taking these medications    oxyCODONE-acetaminophen 5-325 MG tablet Commonly known as: Percocet       TAKE these medications    aspirin EC 81 MG tablet Take 81 mg by mouth in the morning and at bedtime.   B-12 PO Take 1 capsule by mouth daily.   Biotin 1 MG Caps Take 1 mg by mouth daily.   HYDROcodone-acetaminophen 5-325 MG tablet Commonly known as: NORCO/VICODIN Take 1 tablet by mouth as needed for moderate pain or severe pain.   KETOCONAZOLE (TOPICAL) 1 % Sham Apply 1 Application topically 2 (two) times a week.   Magnesium 100 MG Caps Take 100 mg by mouth daily.   methocarbamol 750 MG tablet Commonly known as: ROBAXIN Take 750 mg by mouth as needed for muscle spasms.   multivitamin tablet Take 1 tablet by mouth daily.   olmesartan 20 MG tablet Commonly known as:  BENICAR TAKE 1 TABLET BY MOUTH EVERY DAY   omeprazole 20 MG capsule Commonly known as: PRILOSEC TAKE 1 CAPSULE BY MOUTH  DAILY What changed: when to take this   traZODone 50 MG tablet Commonly known as: DESYREL TAKE 1 TABLET BY MOUTH EVERYDAY AT BEDTIME What changed: See the new instructions.   triamterene-hydrochlorothiazide 37.5-25 MG capsule Commonly known as: DYAZIDE TAKE 1 CAPSULE BY MOUTH EVERY DAY What changed: how much to take    zolmitriptan 5 MG tablet Commonly known as: ZOMIG Take 1 tab po for migraine headache. Do not repeat for 24 hours. What changed:  how much to take how to take this when to take this reasons to take this additional instructions        Follow-up Information     [name], MD Follow up in 1 week(s).   Specialty: Internal Medicine Contact information: [address] [place] [address] [phone]                 Consultations: none  Procedures/Studies:  CT ABDOMEN PELVIS W CONTRAST  Result Date: 05/08/2023 CLINICAL DATA:  Abdominal pain EXAM: CT ABDOMEN AND PELVIS WITH CONTRAST TECHNIQUE: Multidetector CT imaging of the abdomen and pelvis was performed using the standard protocol following bolus administration of intravenous contrast. RADIATION DOSE REDUCTION: This exam was performed according to the departmental dose-optimization program which includes automated exposure control, adjustment of the mA and/or kV according to patient size and/or use of iterative reconstruction technique. CONTRAST:  OMNIPAQUE IOHEXOL 300 MG/ML  SOLN COMPARISON:  None Available. FINDINGS: Lower chest: There is some linear opacity lung bases likely scar or atelectasis. No pleural effusion. Hepatobiliary: Fatty liver infiltration identified. There are some areas of sparing along the gallbladder fossa. The gallbladder is nondilated. No space-occupying liver lesion. Patent portal vein. Pancreas: Preserved pancreatic parenchyma. No obvious mass. However there is significant stranding and fluid around the pancreas including towards the pancreatic groove. Fluid tracks along the anterior perinephric spaces and the pericolic gutters. Spleen: Normal in size without focal abnormality. Adrenals/Urinary Tract: The adrenal glands are preserved. Kidneys are unremarkable without collecting system dilatation or enhancing lesion. The ureters have normal course and caliber down to the bladder.  Preserved contours of the urinary bladder. Stomach/Bowel: There is a redundant course to the sigmoid colon extending into the upper midabdomen. Scattered colonic stool. Normal appendix. Surgical changes from gastric bypass small bowel is nondilated. Vascular/Lymphatic: Aortic atherosclerosis. No enlarged abdominal or pelvic lymph nodes. Reproductive: Status post hysterectomy. No adnexal masses. Other: No free air.  Small fat containing umbilical hernia. Musculoskeletal: Mild degenerative changes of the spine and pelvis. There is significant streak artifact related to the disc hardware at L4-5. IMPRESSION: There is fluid and stranding around the pancreas particularly towards the pancreatic groove and fluid tracking along the anterior perinephric spaces and pericolic gutters. Please correlate for clinical evidence of pancreatitis. The pancreas itself is enhancing. No rim enhancing fluid collections. Severe fatty liver infiltration. Surgical changes from gastric bypass.  Normal appendix Electronically Signed   By: Karen Kays M.D.   On: 05/08/2023 10:48     Subjective: - no chest pain, shortness of breath, no abdominal pain, nausea or vomiting.   Discharge Exam: BP 129/85 (BP Location: Left Arm)   Pulse 91   Temp 98.8 F (37.1 C) (Oral)   Resp 15   Ht 5\' 6"  (1.676 m)   Wt 99.8 kg   LMP 12/30/2005   SpO2 96%   BMI  35.51 kg/m   General: Pt is alert, awake, not in acute distress Cardiovascular: RRR, S1/S2 +, no rubs, no gallops Respiratory: CTA bilaterally, no wheezing, no rhonchi Abdominal: Soft, NT, ND, bowel sounds + Extremities: no edema, no cyanosis    The results of significant diagnostics from this hospitalization (including imaging, microbiology, ancillary and laboratory) are listed below for reference.     Microbiology: No results found for this or any previous visit (from the past 240 hour(s)).   Labs: Basic Metabolic Panel: Recent Labs  Lab 05/08/23 0712 05/08/23 1320  05/09/23 0557 05/10/23 0549 05/11/23 0524  NA 132*  --  135 138 139  K 3.5  --  3.4* 3.4* 3.8  CL 96*  --  99 101 103  CO2 21*  --  26 29 26   GLUCOSE 144*  --  121* 101* 100*  BUN 24*  --  13 8 7   CREATININE 0.94 0.81 0.83 0.81 0.81  CALCIUM 8.7*  --  8.6* 8.4* 8.6*  MG  --   --   --  1.7 1.7   Liver Function Tests: Recent Labs  Lab 05/08/23 0712 05/09/23 0557 05/10/23 0549  AST 22 19 12*  ALT 21 17 12   ALKPHOS 62 54 52  BILITOT 0.4 0.6 0.7  PROT 7.2 6.1* 5.7*  ALBUMIN 4.1 3.6 3.0*   CBC: Recent Labs  Lab 05/08/23 0712 05/08/23 1320 05/09/23 0557 05/10/23 0549 05/11/23 0524  WBC 12.0* 9.9 11.0* 10.4 10.4  NEUTROABS 10.6*  --   --   --   --   HGB 11.4* 10.3* 9.9* 8.6* 8.3*  HCT 34.8* 31.3* 30.1* 25.9* 25.6*  MCV 98.0 97.8 99.3 101.6* 102.4*  PLT 261 232 204 174 181   CBG: No results for input(s): "GLUCAP" in the last 168 hours. Hgb A1c No results for input(s): "HGBA1C" in the last 72 hours. Lipid Profile Recent Labs    05/09/23 0557 05/09/23 0559  CHOL  --  176  HDL  --  53  LDLCALC  --  99  TRIG 131 120  CHOLHDL  --  3.3   Thyroid function studies No results for input(s): "TSH", "T4TOTAL", "T3FREE", "THYROIDAB" in the last 72 hours.  Invalid input(s): "FREET3" Urinalysis    Component Value Date/Time   COLORURINE YELLOW 05/08/2023 0938   APPEARANCEUR CLEAR 05/08/2023 0938   LABSPEC 1.017 05/08/2023 0938   PHURINE 5.0 05/08/2023 0938   GLUCOSEU NEGATIVE 05/08/2023 0938   GLUCOSEU NEGATIVE 03/06/2022 1601   HGBUR NEGATIVE 05/08/2023 0938   BILIRUBINUR NEGATIVE 05/08/2023 0938   KETONESUR 20 (A) 05/08/2023 0938   PROTEINUR NEGATIVE 05/08/2023 0938   UROBILINOGEN 0.2 03/06/2022 1601   NITRITE NEGATIVE 05/08/2023 0938   LEUKOCYTESUR TRACE (A) 05/08/2023 0938    FURTHER DISCHARGE INSTRUCTIONS:   Get Medicines reviewed and adjusted: Please take all your medications with you for your next visit with your Primary MD   Laboratory/radiological  data: Please request your Primary MD to go over all hospital tests and procedure/radiological results at the follow up, please ask your Primary MD to get all Hospital records sent to his/her office.   In some cases, they will be blood work, cultures and biopsy results pending at the time of your discharge. Please request that your primary care M.D. goes through all the records of your hospital data and follows up on these results.   Also Note the following: If you experience worsening of your admission symptoms, develop shortness of breath, life threatening emergency, suicidal  or homicidal thoughts you must seek medical attention immediately by calling 911 or calling your MD immediately  if symptoms less severe.   You must read complete instructions/literature along with all the possible adverse reactions/side effects for all the Medicines you take and that have been prescribed to you. Take any new Medicines after you have completely understood and accpet all the possible adverse reactions/side effects.    Do not drive when taking Pain medications or sleeping medications (Benzodaizepines)   Do not take more than prescribed Pain, Sleep and Anxiety Medications. It is not advisable to combine anxiety,sleep and pain medications without talking with your primary care practitioner   Special Instructions: If you have smoked or chewed Tobacco  in the last 2 yrs please stop smoking, stop any regular Alcohol  and or any Recreational drug use.   Wear Seat belts while driving.   Please note: You were cared for by a hospitalist during your hospital stay. Once you are discharged, your primary care physician will handle any further medical issues. Please note that NO REFILLS for any discharge medications will be authorized once you are discharged, as it is imperative that you return to your primary care physician (or establish a relationship with a primary care physician if you do not have one) for your post  hospital discharge needs so that they can reassess your need for medications and monitor your lab values.  Time coordinating discharge: 35 minutes  SIGNED:  Pamella Pert, MD, PhD 05/11/2023, 12:36 PM

## 2023-05-11 NOTE — Discharge Instructions (Signed)
Follow with [name], MD in 1-2 weeks  Please get a complete blood count and chemistry panel checked by your Primary MD at your next visit, and again as instructed by your Primary MD. Please get your medications reviewed and adjusted by your Primary MD.  Please request your Primary MD to go over all Hospital Tests and Procedure/Radiological results at the follow up, please get all Hospital records sent to your Prim MD by signing hospital release before you go home.  In some cases, there will be blood work, cultures and biopsy results pending at the time of your discharge. Please request that your primary care M.D. goes through all the records of your hospital data and follows up on these results.  If you had Pneumonia of Lung problems at the Hospital: Please get a 2 view Chest X ray done in 6-8 weeks after hospital discharge or sooner if instructed by your Primary MD.  If you have Congestive Heart Failure: Please call your Cardiologist or Primary MD anytime you have any of the following symptoms:  1) 3 pound weight gain in 24 hours or 5 pounds in 1 week  2) shortness of breath, with or without a dry hacking cough  3) swelling in the hands, feet or stomach  4) if you have to sleep on extra pillows at night in order to breathe  Follow cardiac low salt diet and 1.5 lit/day fluid restriction.  If you have diabetes Accuchecks 4 times/day, Once in AM empty stomach and then before each meal. Log in all results and show them to your primary doctor at your next visit. If any glucose reading is under 80 or above 300 call your primary MD immediately.  If you have Seizure/Convulsions/Epilepsy: Please do not drive, operate heavy machinery, participate in activities at heights or participate in high speed sports until you have seen by Primary MD or a Neurologist and advised to do so again. Per [facility] statutes, patients with seizures are not allowed to drive until they have been  seizure-free for six months.  Use caution when using heavy equipment or power tools. Avoid working on ladders or at heights. Take showers instead of baths. Ensure the water temperature is not too high on the home water heater. Do not go swimming alone. Do not lock yourself in a room alone (i.e. bathroom). When caring for infants or small children, sit down when holding, feeding, or changing them to minimize risk of injury to the child in the event you have a seizure. Maintain good sleep hygiene. Avoid alcohol.   If you had Gastrointestinal Bleeding: Please ask your Primary MD to check a complete blood count within one week of discharge or at your next visit. Your endoscopic/colonoscopic biopsies that are pending at the time of discharge, will also need to followed by your Primary MD.  Get Medicines reviewed and adjusted. Please take all your medications with you for your next visit with your Primary MD  Please request your Primary MD to go over all hospital tests and procedure/radiological results at the follow up, please ask your Primary MD to get all Hospital records sent to his/her office.  If you experience worsening of your admission symptoms, develop shortness of breath, life threatening emergency, suicidal or homicidal thoughts you must seek medical attention immediately by calling 911 or calling your MD immediately  if symptoms less severe.  You must read complete instructions/literature along with all the possible adverse reactions/side effects for all the Medicines you  take and that have been prescribed to you. Take any new Medicines after you have completely understood and accpet all the possible adverse reactions/side effects.   Do not drive or operate heavy machinery when taking Pain medications.   Do not take more than prescribed Pain, Sleep and Anxiety Medications  Special Instructions: If you have smoked or chewed Tobacco  in the last 2 yrs please stop smoking, stop any regular  Alcohol  and or any Recreational drug use.  Wear Seat belts while driving.  Please note You were cared for by a hospitalist during your hospital stay. If you have any questions about your discharge medications or the care you received while you were in the hospital after you are discharged, you can call the unit and asked to speak with the hospitalist on call if the hospitalist that took care of you is not available. Once you are discharged, your primary care physician will handle any further medical issues. Please note that NO REFILLS for any discharge medications will be authorized once you are discharged, as it is imperative that you return to your primary care physician (or establish a relationship with a primary care physician if you do not have one) for your aftercare needs so that they can reassess your need for medications and monitor your lab values.  You can reach the hospitalist office at phone [phone] or fax [phone]   If you do not have a primary care physician, you can call [phone] for a physician referral.  Activity: As tolerated with Full fall precautions use walker/cane & assistance as needed    Diet: low fat   Disposition Home

## 2023-05-12 ENCOUNTER — Telehealth: Payer: Self-pay | Admitting: *Deleted

## 2023-05-12 ENCOUNTER — Encounter: Payer: Self-pay | Admitting: *Deleted

## 2023-05-12 NOTE — Transitions of Care (Post Inpatient/ED Visit) (Signed)
05/12/2023  Name: Latoya [name] MRN: [id number] DOB: Sep 08, 1976  Today's TOC FU Call Status: Today's TOC FU Call Status:: Successful TOC FU Call Competed TOC FU Call Complete Date: 05/12/23  Transition Care Management Follow-up Telephone Call Date of Discharge: 05/11/23 Discharge Facility: [facility]) Type of Discharge: Inpatient Admission Primary Inpatient Discharge Diagnosis:: pancreatitis How have you been since you were released from the hospital?: Better ("I am much better; doing okay, feeling much better") Any questions or concerns?: No  Items Reviewed: Did you receive and understand the discharge instructions provided?: Yes (thoroughly reviewed with patient who verbalizes good understanding of same) Medications obtained,verified, and reconciled?: Yes (Medications Reviewed) (Full medication reconciliation/ review completed; no concerns or discrepancies identified; confirmed patient obtained/ is taking all newly Rx'd medications as instructed; self-manages medications and denies questions/ concerns around medications today) Any new allergies since your discharge?: No Dietary orders reviewed?: Yes Type of Diet Ordered:: low fat Do you have support at home?: Yes People in Home: spouse Name of Support/Comfort Primary Source: Reports independent in self-care activities; spouse assists as/ if needed/ indicated  Medications Reviewed Today: Medications Reviewed Today     Reviewed by Michaela Corner, RN (Registered Nurse) on 05/12/23 at 1610  Med List Status: <None>   Medication Order Taking? Sig Documenting Provider Last Dose Status Informant  aspirin EC 81 MG tablet [id number]  Take 81 mg by mouth in the morning and at bedtime. [provider]  Active Self, Pharmacy Records  Biotin 1 MG CAPS [id number]  Take 1 mg by mouth daily. [provider]  Active Self, Pharmacy Records  Cyanocobalamin (B-12 PO) [id number]  Take 1 capsule by mouth daily. [provider]  Active Self, Pharmacy Records  HYDROcodone-acetaminophen (NORCO/VICODIN) 5-325 MG tablet [id number]  Take 1 tablet by mouth as needed for moderate pain or severe pain. [provider]  Active Self, Pharmacy Records  KETOCONAZOLE, TOPICAL, 1 % SHAM [id number]  Apply 1 Application topically 2 (two) times a week. [provider]  Active Self, Pharmacy Records           Med Note (CRUTHIS, CHLOE C   Thu May 08, 2023  1:22 PM) No fill hx found for this medication. Pt is adamant she is using it twice weekly.   Magnesium 100 MG CAPS [id number]  Take 100 mg by mouth daily. [provider]  Active Self, Pharmacy Records  methocarbamol (ROBAXIN) 750 MG tablet [id number]  Take 750 mg by mouth as needed for muscle spasms. [provider]  Active Self, Pharmacy Records  Multiple Vitamin (MULTIVITAMIN) tablet [id number]  Take 1 tablet by mouth daily. [provider]  Active Self, Pharmacy Records  olmesartan (BENICAR) 20 MG tablet [id number]  TAKE 1 TABLET BY MOUTH EVERY DAY  Patient taking differently: Take 20 mg by mouth daily.   [name], MD  Active Self, Pharmacy Records  omeprazole (PRILOSEC) 20 MG capsule [id number]  TAKE 1 CAPSULE BY MOUTH  DAILY  Patient taking differently: Take 20 mg by mouth 2 (two) times a week.   Dara Lords, MD  Active Self, Pharmacy Records           Med Note (CRUTHIS, Madelon Lips May 08, 2023  1:22 PM) Pt is adamant she is taking this medication twice weekly.   traZODone (DESYREL) 50 MG tablet [id number]  TAKE 1 TABLET BY MOUTH EVERYDAY AT BEDTIME  Patient taking differently: Take 100 mg by mouth at bedtime as needed  for sleep.   [name], MD  Active Self, Pharmacy Records  triamterene-hydrochlorothiazide [facility]) 37.5-25 MG capsule [id number]  TAKE 1 CAPSULE BY MOUTH EVERY DAY  Patient taking differently: Take 1 capsule by mouth daily.   [name], MD  Active Self, Pharmacy Records  zolmitriptan  (ZOMIG) 5 MG tablet [id number]  Take 1 tab po for migraine headache. Do not repeat for 24 hours.  Patient taking differently: Take 2.5 mg by mouth as needed for migraine.   Olivia Mackie, NP  Active Self, Pharmacy Records           Med Note (CRUTHIS, Madelon Lips May 08, 2023  1:23 PM) No fill hx found for this medication. Pt is adamant she still has it at home and is using it PRN.             Home Care and Equipment/Supplies: Were Home Health Services Ordered?: No Any new equipment or medical supplies ordered?: No  Functional Questionnaire: Do you need assistance with bathing/showering or dressing?: No Do you need assistance with meal preparation?: No Do you need assistance with eating?: No Do you have difficulty maintaining continence: No Do you need assistance with getting out of bed/getting out of a chair/moving?: No Do you have difficulty managing or taking your medications?: No  Follow up appointments reviewed: PCP Follow-up appointment confirmed?: Yes Date of PCP follow-up appointment?: 05/22/23 Follow-up Provider: PCP Specialist Hospital Follow-up appointment confirmed?: NA Do you need transportation to your follow-up appointment?: No Do you understand care options if your condition(s) worsen?: Yes-patient verbalized understanding  SDOH Interventions Today    Flowsheet Row Most Recent Value  SDOH Interventions   Food Insecurity Interventions Intervention Not Indicated  Transportation Interventions Intervention Not Indicated       TOC Interventions Today    Flowsheet Row Most Recent Value  TOC Interventions   TOC Interventions Discussed/Reviewed TOC Interventions Discussed  [Patient declines need for ongoing/ further care coordination outreach,  no care coordination needs identified at time of TOC call today,  provided my direct contact information should questions/ concerns/ needs arise post-TOC call]      Interventions Today    Flowsheet Row Most  Recent Value  Chronic Disease   Chronic disease during today's visit Other  [pancreatitis]  General Interventions   General Interventions Discussed/Reviewed General Interventions Discussed, Doctor Visits, Durable Medical Equipment (DME)  Doctor Visits Discussed/Reviewed Doctor Visits Discussed, PCP  Durable Medical Equipment (DME) Dan Humphreys  [due to recent broken leg- not related to recent hospital visit]  PCP/Specialist Visits Compliance with follow-up visit  Nutrition Interventions   Nutrition Discussed/Reviewed Nutrition Discussed, Decreasing fats  Pharmacy Interventions   Pharmacy Dicussed/Reviewed Pharmacy Topics Discussed  [Full medication review with updating medication list in EHR per patient report]      Caryl Pina, RN, BSN, CCRN Alumnus RN CM Care Coordination/ Transition of Care- [facility] Care Management [phone]: direct office

## 2023-05-13 ENCOUNTER — Ambulatory Visit (INDEPENDENT_AMBULATORY_CARE_PROVIDER_SITE_OTHER): Payer: BC Managed Care – PPO

## 2023-05-13 DIAGNOSIS — E28319 Asymptomatic premature menopause: Secondary | ICD-10-CM

## 2023-05-13 DIAGNOSIS — Z13820 Encounter for screening for osteoporosis: Secondary | ICD-10-CM | POA: Diagnosis not present

## 2023-05-15 DIAGNOSIS — S82842A Displaced bimalleolar fracture of left lower leg, initial encounter for closed fracture: Secondary | ICD-10-CM | POA: Diagnosis not present

## 2023-05-20 DIAGNOSIS — S82842D Displaced bimalleolar fracture of left lower leg, subsequent encounter for closed fracture with routine healing: Secondary | ICD-10-CM | POA: Diagnosis not present

## 2023-05-22 ENCOUNTER — Ambulatory Visit (INDEPENDENT_AMBULATORY_CARE_PROVIDER_SITE_OTHER): Payer: BC Managed Care – PPO | Admitting: Internal Medicine

## 2023-05-22 ENCOUNTER — Encounter: Payer: Self-pay | Admitting: Internal Medicine

## 2023-05-22 VITALS — BP 108/68 | HR 85 | Temp 98.3°F | Resp 16 | Ht 66.0 in | Wt 204.0 lb

## 2023-05-22 DIAGNOSIS — D539 Nutritional anemia, unspecified: Secondary | ICD-10-CM

## 2023-05-22 DIAGNOSIS — K760 Fatty (change of) liver, not elsewhere classified: Secondary | ICD-10-CM

## 2023-05-22 DIAGNOSIS — I959 Hypotension, unspecified: Secondary | ICD-10-CM

## 2023-05-22 DIAGNOSIS — K8500 Idiopathic acute pancreatitis without necrosis or infection: Secondary | ICD-10-CM | POA: Diagnosis not present

## 2023-05-22 DIAGNOSIS — I10 Essential (primary) hypertension: Secondary | ICD-10-CM | POA: Diagnosis not present

## 2023-05-22 LAB — CBC WITH DIFFERENTIAL/PLATELET
Basophils Absolute: 0.1 10*3/uL (ref 0.0–0.1)
Basophils Relative: 0.8 % (ref 0.0–3.0)
Eosinophils Absolute: 0.1 10*3/uL (ref 0.0–0.7)
Eosinophils Relative: 1.5 % (ref 0.0–5.0)
HCT: 34.2 % — ABNORMAL LOW (ref 36.0–46.0)
Hemoglobin: 11.1 g/dL — ABNORMAL LOW (ref 12.0–15.0)
Lymphocytes Relative: 25 % (ref 12.0–46.0)
Lymphs Abs: 1.7 10*3/uL (ref 0.7–4.0)
MCHC: 32.5 g/dL (ref 30.0–36.0)
MCV: 95.9 fl (ref 78.0–100.0)
Monocytes Absolute: 0.4 10*3/uL (ref 0.1–1.0)
Monocytes Relative: 6.3 % (ref 3.0–12.0)
Neutro Abs: 4.6 10*3/uL (ref 1.4–7.7)
Neutrophils Relative %: 66.4 % (ref 43.0–77.0)
Platelets: 537 10*3/uL — ABNORMAL HIGH (ref 150.0–400.0)
RBC: 3.56 Mil/uL — ABNORMAL LOW (ref 3.87–5.11)
RDW: 13.9 % (ref 11.5–15.5)
WBC: 6.9 10*3/uL (ref 4.0–10.5)

## 2023-05-22 LAB — PROTIME-INR
INR: 1.1 ratio — ABNORMAL HIGH (ref 0.8–1.0)
Prothrombin Time: 11.8 s (ref 9.6–13.1)

## 2023-05-22 LAB — FOLATE: Folate: >23.9 ng/mL (ref >5.9)

## 2023-05-22 LAB — BASIC METABOLIC PANEL
BUN: 20 mg/dL (ref 6–23)
CO2: 30 mEq/L (ref 19–32)
Calcium: 9.9 mg/dL (ref 8.4–10.5)
Chloride: 99 mEq/L (ref 96–112)
Creatinine, Ser: 0.93 mg/dL (ref 0.40–1.20)
GFR: 72.49 mL/min (ref >60.00)
Glucose, Bld: 110 mg/dL — ABNORMAL HIGH (ref 70–99)
Potassium: 4.1 mEq/L (ref 3.5–5.1)
Sodium: 140 mEq/L (ref 135–145)

## 2023-05-22 LAB — CORTISOL: Cortisol, Plasma: 11.7 ug/dL

## 2023-05-22 LAB — TSH: TSH: 2.09 u[IU]/mL (ref 0.35–5.50)

## 2023-05-22 LAB — IBC + FERRITIN
Ferritin: 153.3 ng/mL (ref 10.0–291.0)
Iron: 86 ug/dL (ref 42–145)
Saturation Ratios: 26.5 % (ref 20.0–50.0)
TIBC: 324.8 ug/dL (ref 250.0–450.0)
Transferrin: 232 mg/dL (ref 212.0–360.0)

## 2023-05-22 LAB — LIPASE: Lipase: 107 U/L — ABNORMAL HIGH (ref 11.0–59.0)

## 2023-05-22 LAB — VITAMIN B12: Vitamin B-12: >1500 pg/mL — ABNORMAL HIGH (ref 211–911)

## 2023-05-22 NOTE — Patient Instructions (Signed)

## 2023-05-22 NOTE — Progress Notes (Unsigned)
Subjective:  Patient ID: Latoya [name], female    DOB: August 24, 1976  Age: 47 y.o. MRN: [id number]  CC: Hypertension   HPI [name] presents for f/up ----  She was recently admitted for acute pancreatitis.  Multiple possible factors have been implicated.  She feels better since discharge with no abdominal pain, nausea, vomiting, diarrhea, fever, chills, loss of appetite, or weight loss.  PCP: [name], MD   Admit date: 05/08/2023 Discharge date: 05/11/2023   Admitted From: home Disposition:  home   Recommendations for Outpatient Follow-up:  Follow up with PCP in 1-2 weeks   Home Health: none Equipment/Devices: none   Outpatient Medications Prior to Visit  Medication Sig Dispense Refill   aspirin EC 81 MG tablet Take 81 mg by mouth in the morning and at bedtime.     Biotin 1 MG CAPS Take 1 mg by mouth daily.     Cyanocobalamin (B-12 PO) Take 1 capsule by mouth daily.     HYDROcodone-acetaminophen (NORCO/VICODIN) 5-325 MG tablet Take 1 tablet by mouth as needed for moderate pain or severe pain.     Magnesium 100 MG CAPS Take 100 mg by mouth daily.     Multiple Vitamin (MULTIVITAMIN) tablet Take 1 tablet by mouth daily.     omeprazole (PRILOSEC) 20 MG capsule TAKE 1 CAPSULE BY MOUTH  DAILY (Patient taking differently: Take 20 mg by mouth 2 (two) times a week.) 90 capsule 0   traZODone (DESYREL) 50 MG tablet TAKE 1 TABLET BY MOUTH EVERYDAY AT BEDTIME (Patient taking differently: Take 100 mg by mouth at bedtime as needed for sleep.) 90 tablet 0   zolmitriptan (ZOMIG) 5 MG tablet Take 1 tab po for migraine headache. Do not repeat for 24 hours. (Patient taking differently: Take 2.5 mg by mouth as needed for migraine.) 10 tablet 3   methocarbamol (ROBAXIN) 750 MG tablet Take 750 mg by mouth as needed for muscle spasms.     olmesartan (BENICAR) 20 MG tablet TAKE 1 TABLET BY MOUTH EVERY DAY (Patient taking differently: Take 20 mg by mouth daily.) 90 tablet 0    triamterene-hydrochlorothiazide (DYAZIDE) 37.5-25 MG capsule TAKE 1 CAPSULE BY MOUTH EVERY DAY (Patient taking differently: Take 1 capsule by mouth daily.) 90 capsule 0   No facility-administered medications prior to visit.    ROS Review of Systems  Constitutional:  Negative for chills, diaphoresis, fatigue and fever.  HENT: Negative.    Eyes: Negative.   Respiratory:  Negative for apnea, cough, shortness of breath and wheezing.   Cardiovascular:  Negative for chest pain, palpitations and leg swelling.  Gastrointestinal:  Negative for abdominal pain, constipation, diarrhea and vomiting.  Endocrine: Negative.   Genitourinary:  Negative for difficulty urinating.  Musculoskeletal: Negative.  Negative for arthralgias, myalgias and neck pain.  Skin: Negative.   Neurological:  Negative for dizziness, weakness and light-headedness.  Hematological:  Negative for adenopathy. Does not bruise/bleed easily.  Psychiatric/Behavioral: Negative.      Objective:  BP 108/68 (BP Location: Right Arm, Patient Position: Sitting, Cuff Size: Large)   Pulse 85   Temp 98.3 F (36.8 C) (Oral)   Resp 16   Ht 5\' 6"  (1.676 m)   Wt 204 lb (92.5 kg)   LMP 12/30/2005   SpO2 97%   BMI 32.93 kg/m   BP Readings from Last 3 Encounters:  05/22/23 108/68  05/11/23 129/85  02/13/23 127/79    Wt Readings from Last 3 Encounters:  05/22/23 204 lb (92.5 kg)  05/08/23 220 lb 0.3 oz (99.8 kg)  02/13/23 220 lb 0.3 oz (99.8 kg)    Physical Exam Vitals reviewed.  Constitutional:      Appearance: She is not ill-appearing.  HENT:     Nose: Nose normal.     Mouth/Throat:     Mouth: Mucous membranes are moist.  Eyes:     General: No scleral icterus.    Conjunctiva/sclera: Conjunctivae normal.  Cardiovascular:     Rate and Rhythm: Normal rate and regular rhythm.     Pulses: Normal pulses.     Heart sounds: No murmur heard.    No gallop.  Pulmonary:     Effort: Pulmonary effort is normal.     Breath  sounds: No stridor. No wheezing, rhonchi or rales.  Abdominal:     General: Abdomen is flat.     Palpations: There is no mass.     Tenderness: There is no abdominal tenderness. There is no guarding.     Hernia: No hernia is present.  Musculoskeletal:        General: Normal range of motion.     Cervical back: Neck supple.     Right lower leg: No edema.     Left lower leg: No edema.  Lymphadenopathy:     Cervical: No cervical adenopathy.  Skin:    General: Skin is warm and dry.     Coloration: Skin is not pale.  Neurological:     General: No focal deficit present.     Mental Status: She is alert. Mental status is at baseline.  Psychiatric:        Mood and Affect: Mood normal.        Thought Content: Thought content normal.        Judgment: Judgment normal.     Lab Results  Component Value Date   WBC 6.9 05/22/2023   HGB 11.1 (L) 05/22/2023   HCT 34.2 (L) 05/22/2023   PLT 537.0 (H) 05/22/2023   GLUCOSE 110 (H) 05/22/2023   CHOL 176 05/09/2023   TRIG 120 05/09/2023   HDL 53 05/09/2023   LDLDIRECT 67.3 11/15/2014   LDLCALC 99 05/09/2023   ALT 12 05/10/2023   AST 12 (L) 05/10/2023   NA 140 05/22/2023   K 4.1 05/22/2023   CL 99 05/22/2023   CREATININE 0.93 05/22/2023   BUN 20 05/22/2023   CO2 30 05/22/2023   TSH 2.09 05/22/2023   INR 1.1 (H) 05/22/2023    CT ABDOMEN PELVIS W CONTRAST  Result Date: 05/08/2023 CLINICAL DATA:  Abdominal pain EXAM: CT ABDOMEN AND PELVIS WITH CONTRAST TECHNIQUE: Multidetector CT imaging of the abdomen and pelvis was performed using the standard protocol following bolus administration of intravenous contrast. RADIATION DOSE REDUCTION: This exam was performed according to the departmental dose-optimization program which includes automated exposure control, adjustment of the mA and/or kV according to patient size and/or use of iterative reconstruction technique. CONTRAST:  OMNIPAQUE IOHEXOL 300 MG/ML  SOLN COMPARISON:  None Available.  FINDINGS: Lower chest: There is some linear opacity lung bases likely scar or atelectasis. No pleural effusion. Hepatobiliary: Fatty liver infiltration identified. There are some areas of sparing along the gallbladder fossa. The gallbladder is nondilated. No space-occupying liver lesion. Patent portal vein. Pancreas: Preserved pancreatic parenchyma. No obvious mass. However there is significant stranding and fluid around the pancreas including towards the pancreatic groove. Fluid tracks along the anterior perinephric spaces and the pericolic gutters. Spleen: Normal in size without focal abnormality. Adrenals/Urinary Tract:  The adrenal glands are preserved. Kidneys are unremarkable without collecting system dilatation or enhancing lesion. The ureters have normal course and caliber down to the bladder. Preserved contours of the urinary bladder. Stomach/Bowel: There is a redundant course to the sigmoid colon extending into the upper midabdomen. Scattered colonic stool. Normal appendix. Surgical changes from gastric bypass small bowel is nondilated. Vascular/Lymphatic: Aortic atherosclerosis. No enlarged abdominal or pelvic lymph nodes. Reproductive: Status post hysterectomy. No adnexal masses. Other: No free air.  Small fat containing umbilical hernia. Musculoskeletal: Mild degenerative changes of the spine and pelvis. There is significant streak artifact related to the disc hardware at L4-5. IMPRESSION: There is fluid and stranding around the pancreas particularly towards the pancreatic groove and fluid tracking along the anterior perinephric spaces and pericolic gutters. Please correlate for clinical evidence of pancreatitis. The pancreas itself is enhancing. No rim enhancing fluid collections. Severe fatty liver infiltration. Surgical changes from gastric bypass.  Normal appendix Electronically Signed   By: Karen Kays M.D.   On: 05/08/2023 10:48    Assessment & Plan:   Hypotension, unspecified hypotension  type- Evaluation for secondary causes is negative.  Will discontinue the antihypertensives. -     TSH; Future -     Cortisol; Future -     Basic metabolic panel; Future  Idiopathic acute pancreatitis without infection or necrosis- Improvement noted. -     Lipase; Future  Deficiency anemia- Will evaluate for vitamin deficiencies. -     IBC + Ferritin; Future -     Reticulocytes; Future -     CBC with Differential/Platelet; Future -     Vitamin B1; Future -     Folate; Future -     Vitamin B12; Future -     Zinc; Future  Fatty liver- She has been decreasing her alcohol intake. -     Protime-INR; Future  Primary hypertension- Her blood pressure is overcontrolled.  Will discontinue her antihypertensives. -     Basic metabolic panel; Future     Follow-up: Return in about 3 months (around 08/22/2023).  Sanda Linger, MD

## 2023-05-23 DIAGNOSIS — S82842A Displaced bimalleolar fracture of left lower leg, initial encounter for closed fracture: Secondary | ICD-10-CM | POA: Diagnosis not present

## 2023-05-26 ENCOUNTER — Encounter: Payer: Self-pay | Admitting: Internal Medicine

## 2023-05-28 ENCOUNTER — Encounter: Payer: Self-pay | Admitting: Internal Medicine

## 2023-05-28 LAB — RETICULOCYTES
ABS Retic: 59330 cells/uL (ref 20000–80000)
Retic Ct Pct: 1.7 %

## 2023-05-28 LAB — VITAMIN B1: Vitamin B1 (Thiamine): 34 nmol/L — ABNORMAL HIGH (ref 8–30)

## 2023-05-28 LAB — ZINC: Zinc: 82 ug/dL (ref 60–130)

## 2023-05-30 DIAGNOSIS — S82842A Displaced bimalleolar fracture of left lower leg, initial encounter for closed fracture: Secondary | ICD-10-CM | POA: Diagnosis not present

## 2023-06-01 NOTE — Progress Notes (Unsigned)
    Subjective:    Patient ID: Latoya [name], female    DOB: [birth year], 47 y.o.   MRN: [id number]      HPI Latoya [name] is here for No chief complaint on file.   Right calf and ankle swelling intermittently.  Had recent injury in her left leg and does have swelling on that side.  Typically does not have swelling.  Did stop BP medication that had a diuretic in it recently.     Medications and allergies reviewed with patient and updated if appropriate.  Current Outpatient Medications on File Prior to Visit  Medication Sig Dispense Refill   aspirin EC 81 MG tablet Take 81 mg by mouth in the morning and at bedtime.     Biotin 1 MG CAPS Take 1 mg by mouth daily.     Cyanocobalamin (B-12 PO) Take 1 capsule by mouth daily.     HYDROcodone-acetaminophen (NORCO/VICODIN) 5-325 MG tablet Take 1 tablet by mouth as needed for moderate pain or severe pain.     Magnesium 100 MG CAPS Take 100 mg by mouth daily.     Multiple Vitamin (MULTIVITAMIN) tablet Take 1 tablet by mouth daily.     omeprazole (PRILOSEC) 20 MG capsule TAKE 1 CAPSULE BY MOUTH  DAILY (Patient taking differently: Take 20 mg by mouth 2 (two) times a week.) 90 capsule 0   traZODone (DESYREL) 50 MG tablet TAKE 1 TABLET BY MOUTH EVERYDAY AT BEDTIME (Patient taking differently: Take 100 mg by mouth at bedtime as needed for sleep.) 90 tablet 0   zolmitriptan (ZOMIG) 5 MG tablet Take 1 tab po for migraine headache. Do not repeat for 24 hours. (Patient taking differently: Take 2.5 mg by mouth as needed for migraine.) 10 tablet 3   No current facility-administered medications on file prior to visit.    Review of Systems     Objective:  There were no vitals filed for this visit. BP Readings from Last 3 Encounters:  05/22/23 108/68  05/11/23 129/85  02/13/23 127/79   Wt Readings from Last 3 Encounters:  05/22/23 204 lb (92.5 kg)  05/08/23 220 lb 0.3 oz (99.8 kg)  02/13/23 220 lb 0.3 oz (99.8 kg)   There is no height or  weight on file to calculate BMI.    Physical Exam         Assessment & Plan:    See Problem List for Assessment and Plan of chronic medical problems.

## 2023-06-02 ENCOUNTER — Encounter: Payer: Self-pay | Admitting: Internal Medicine

## 2023-06-02 ENCOUNTER — Ambulatory Visit (INDEPENDENT_AMBULATORY_CARE_PROVIDER_SITE_OTHER): Payer: BC Managed Care – PPO | Admitting: Internal Medicine

## 2023-06-02 VITALS — BP 128/70 | HR 79 | Temp 98.1°F | Ht 66.0 in

## 2023-06-02 DIAGNOSIS — R600 Localized edema: Secondary | ICD-10-CM

## 2023-06-02 DIAGNOSIS — R000 Tachycardia, unspecified: Secondary | ICD-10-CM

## 2023-06-02 DIAGNOSIS — R0602 Shortness of breath: Secondary | ICD-10-CM | POA: Diagnosis not present

## 2023-06-02 DIAGNOSIS — I10 Essential (primary) hypertension: Secondary | ICD-10-CM | POA: Diagnosis not present

## 2023-06-02 MED ORDER — TRIAMTERENE-HCTZ 37.5-25 MG PO TABS: 0.5000 | ORAL_TABLET | Freq: Every day | ORAL | 1 refills | Status: DC

## 2023-06-02 NOTE — Patient Instructions (Addendum)
       Medications changes include :  restart your BP medication - 1/2 pill daily    Monitor your BP at home    Return if symptoms worsen or fail to improve.

## 2023-06-06 DIAGNOSIS — S82842A Displaced bimalleolar fracture of left lower leg, initial encounter for closed fracture: Secondary | ICD-10-CM | POA: Diagnosis not present

## 2023-06-13 DIAGNOSIS — S82842A Displaced bimalleolar fracture of left lower leg, initial encounter for closed fracture: Secondary | ICD-10-CM | POA: Diagnosis not present

## 2023-06-24 DIAGNOSIS — M25572 Pain in left ankle and joints of left foot: Secondary | ICD-10-CM | POA: Diagnosis not present

## 2023-06-26 DIAGNOSIS — S82842A Displaced bimalleolar fracture of left lower leg, initial encounter for closed fracture: Secondary | ICD-10-CM | POA: Diagnosis not present

## 2023-07-01 DIAGNOSIS — S82842A Displaced bimalleolar fracture of left lower leg, initial encounter for closed fracture: Secondary | ICD-10-CM | POA: Diagnosis not present

## 2023-07-16 DIAGNOSIS — S82842A Displaced bimalleolar fracture of left lower leg, initial encounter for closed fracture: Secondary | ICD-10-CM | POA: Diagnosis not present

## 2023-07-22 DIAGNOSIS — S82842A Displaced bimalleolar fracture of left lower leg, initial encounter for closed fracture: Secondary | ICD-10-CM | POA: Diagnosis not present

## 2023-07-29 DIAGNOSIS — S82842A Displaced bimalleolar fracture of left lower leg, initial encounter for closed fracture: Secondary | ICD-10-CM | POA: Diagnosis not present

## 2023-08-26 DIAGNOSIS — Z1231 Encounter for screening mammogram for malignant neoplasm of breast: Secondary | ICD-10-CM | POA: Diagnosis not present

## 2023-08-28 ENCOUNTER — Encounter: Payer: Self-pay | Admitting: Nurse Practitioner

## 2023-09-30 DIAGNOSIS — G5763 Lesion of plantar nerve, bilateral lower limbs: Secondary | ICD-10-CM | POA: Diagnosis not present

## 2023-09-30 DIAGNOSIS — Z4889 Encounter for other specified surgical aftercare: Secondary | ICD-10-CM | POA: Diagnosis not present

## 2023-09-30 DIAGNOSIS — G5762 Lesion of plantar nerve, left lower limb: Secondary | ICD-10-CM | POA: Diagnosis not present

## 2023-09-30 DIAGNOSIS — S82842D Displaced bimalleolar fracture of left lower leg, subsequent encounter for closed fracture with routine healing: Secondary | ICD-10-CM | POA: Diagnosis not present

## 2023-09-30 DIAGNOSIS — G5761 Lesion of plantar nerve, right lower limb: Secondary | ICD-10-CM | POA: Diagnosis not present

## 2023-10-09 DIAGNOSIS — L821 Other seborrheic keratosis: Secondary | ICD-10-CM | POA: Diagnosis not present

## 2023-11-11 DIAGNOSIS — S82842A Displaced bimalleolar fracture of left lower leg, initial encounter for closed fracture: Secondary | ICD-10-CM | POA: Diagnosis not present

## 2023-11-11 DIAGNOSIS — G5763 Lesion of plantar nerve, bilateral lower limbs: Secondary | ICD-10-CM | POA: Diagnosis not present

## 2023-11-30 ENCOUNTER — Other Ambulatory Visit: Payer: Self-pay | Admitting: Internal Medicine

## 2024-01-02 ENCOUNTER — Ambulatory Visit (INDEPENDENT_AMBULATORY_CARE_PROVIDER_SITE_OTHER): Payer: BC Managed Care – PPO | Admitting: Nurse Practitioner

## 2024-01-02 VITALS — BP 130/78 | HR 90 | Temp 98.2°F | Ht 66.0 in | Wt 226.2 lb

## 2024-01-02 DIAGNOSIS — I10 Essential (primary) hypertension: Secondary | ICD-10-CM | POA: Diagnosis not present

## 2024-01-02 LAB — BASIC METABOLIC PANEL
BUN: 20 mg/dL (ref 6–23)
CO2: 26 meq/L (ref 19–32)
Calcium: 9.5 mg/dL (ref 8.4–10.5)
Chloride: 104 meq/L (ref 96–112)
Creatinine, Ser: 0.84 mg/dL (ref 0.40–1.20)
GFR: 81.56 mL/min (ref >60.00)
Glucose, Bld: 100 mg/dL — ABNORMAL HIGH (ref 70–99)
Potassium: 4 meq/L (ref 3.5–5.1)
Sodium: 139 meq/L (ref 135–145)

## 2024-01-02 MED ORDER — TRIAMTERENE-HCTZ 37.5-25 MG PO TABS
0.5000 | ORAL_TABLET | Freq: Every day | ORAL | 1 refills | Status: DC
Start: 2024-01-02 — End: 2024-06-29

## 2024-01-02 NOTE — Progress Notes (Signed)
   Established Patient Office Visit  Subjective   Patient ID: Latoya [name], female    DOB: 11-21-1976  Age: 48 y.o. MRN: [id number]  Chief Complaint  Patient presents with   Hypertension    HTN: Chronic, take Maxzide -25 1/2 tablet by mouth daily. Tolerates medication well. Last metabolic panel identified normal potassium and stable GFR. Reports she will run out of medication tomorrow, requesting medication refill.     Review of Systems  Respiratory:  Negative for shortness of breath.   Cardiovascular:  Negative for chest pain.      Objective:     BP 130/78   Pulse 90   Temp 98.2 F (36.8 C) (Temporal)   Ht 5' 6 (1.676 m)   Wt 226 lb 4 oz (102.6 kg)   LMP 12/30/2005   SpO2 94%   BMI 36.52 kg/m    Physical Exam Vitals reviewed.  Constitutional:      General: She is not in acute distress.    Appearance: Normal appearance.  HENT:     Head: Normocephalic and atraumatic.  Cardiovascular:     Rate and Rhythm: Normal rate and regular rhythm.     Pulses: Normal pulses.     Heart sounds: Normal heart sounds.  Pulmonary:     Effort: Pulmonary effort is normal.     Breath sounds: Normal breath sounds.  Skin:    General: Skin is warm and dry.  Neurological:     General: No focal deficit present.     Mental Status: She is alert and oriented to person, place, and time.  Psychiatric:        Mood and Affect: Mood normal.        Behavior: Behavior normal.        Judgment: Judgment normal.      No results found for any visits on 01/02/24.    The 10-year ASCVD risk score (Arnett DK, et al., 2019) is: 4.1%    Assessment & Plan:   Problem List Items Addressed This Visit       Cardiovascular and Mediastinum   Primary hypertension - Primary   Chronic, stable Continue Maxzide -25 1/2 tablet by mouth daily. Check BMP today.  Follow-up with PCP for annual physical in 3-6 months.       Relevant Medications   triamterene -hydrochlorothiazide  (MAXZIDE -25) 37.5-25  MG tablet   Other Relevant Orders   Basic metabolic panel    Return in about 5 months (around 06/01/2024) for CPE with Dr. Joshua.    Lauraine FORBES Pereyra, NP

## 2024-01-02 NOTE — Assessment & Plan Note (Signed)
 Chronic, stable Continue Maxzide-25 1/2 tablet by mouth daily. Check BMP today.  Follow-up with PCP for annual physical in 3-6 months.

## 2024-06-29 ENCOUNTER — Other Ambulatory Visit: Payer: Self-pay | Admitting: Nurse Practitioner

## 2024-06-29 DIAGNOSIS — I10 Essential (primary) hypertension: Secondary | ICD-10-CM

## 2024-07-15 ENCOUNTER — Emergency Department (HOSPITAL_BASED_OUTPATIENT_CLINIC_OR_DEPARTMENT_OTHER)

## 2024-07-15 ENCOUNTER — Encounter (HOSPITAL_BASED_OUTPATIENT_CLINIC_OR_DEPARTMENT_OTHER): Payer: Self-pay

## 2024-07-15 ENCOUNTER — Other Ambulatory Visit: Payer: Self-pay

## 2024-07-15 ENCOUNTER — Inpatient Hospital Stay (HOSPITAL_BASED_OUTPATIENT_CLINIC_OR_DEPARTMENT_OTHER)
Admission: EM | Admit: 2024-07-15 | Discharge: 2024-07-17 | DRG: 439 | Disposition: A | Discharge status: Home / Self Care | Attending: Internal Medicine | Admitting: Internal Medicine

## 2024-07-15 DIAGNOSIS — E66811 Obesity, class 1: Secondary | ICD-10-CM | POA: Diagnosis present

## 2024-07-15 DIAGNOSIS — G43909 Migraine, unspecified, not intractable, without status migrainosus: Secondary | ICD-10-CM | POA: Diagnosis present

## 2024-07-15 DIAGNOSIS — R188 Other ascites: Secondary | ICD-10-CM | POA: Diagnosis not present

## 2024-07-15 DIAGNOSIS — Z885 Allergy status to narcotic agent status: Secondary | ICD-10-CM | POA: Diagnosis not present

## 2024-07-15 DIAGNOSIS — K852 Alcohol induced acute pancreatitis without necrosis or infection: Principal | ICD-10-CM | POA: Diagnosis present

## 2024-07-15 DIAGNOSIS — F1020 Alcohol dependence, uncomplicated: Secondary | ICD-10-CM | POA: Diagnosis not present

## 2024-07-15 DIAGNOSIS — Z79899 Other long term (current) drug therapy: Secondary | ICD-10-CM | POA: Diagnosis not present

## 2024-07-15 DIAGNOSIS — Z6834 Body mass index (BMI) 34.0-34.9, adult: Secondary | ICD-10-CM | POA: Diagnosis not present

## 2024-07-15 DIAGNOSIS — R1010 Upper abdominal pain, unspecified: Secondary | ICD-10-CM | POA: Diagnosis not present

## 2024-07-15 DIAGNOSIS — Z833 Family history of diabetes mellitus: Secondary | ICD-10-CM

## 2024-07-15 DIAGNOSIS — F17290 Nicotine dependence, other tobacco product, uncomplicated: Secondary | ICD-10-CM | POA: Diagnosis not present

## 2024-07-15 DIAGNOSIS — Z9884 Bariatric surgery status: Secondary | ICD-10-CM

## 2024-07-15 DIAGNOSIS — I10 Essential (primary) hypertension: Secondary | ICD-10-CM | POA: Diagnosis not present

## 2024-07-15 DIAGNOSIS — Z9851 Tubal ligation status: Secondary | ICD-10-CM | POA: Diagnosis not present

## 2024-07-15 DIAGNOSIS — Z7982 Long term (current) use of aspirin: Secondary | ICD-10-CM

## 2024-07-15 DIAGNOSIS — Z981 Arthrodesis status: Secondary | ICD-10-CM | POA: Diagnosis not present

## 2024-07-15 DIAGNOSIS — Z888 Allergy status to other drugs, medicaments and biological substances status: Secondary | ICD-10-CM

## 2024-07-15 DIAGNOSIS — E872 Acidosis, unspecified: Secondary | ICD-10-CM | POA: Diagnosis not present

## 2024-07-15 DIAGNOSIS — Z8249 Family history of ischemic heart disease and other diseases of the circulatory system: Secondary | ICD-10-CM | POA: Diagnosis not present

## 2024-07-15 DIAGNOSIS — K760 Fatty (change of) liver, not elsewhere classified: Secondary | ICD-10-CM | POA: Diagnosis present

## 2024-07-15 DIAGNOSIS — R1013 Epigastric pain: Secondary | ICD-10-CM | POA: Diagnosis not present

## 2024-07-15 DIAGNOSIS — Z90710 Acquired absence of both cervix and uterus: Secondary | ICD-10-CM

## 2024-07-15 DIAGNOSIS — K859 Acute pancreatitis without necrosis or infection, unspecified: Secondary | ICD-10-CM | POA: Diagnosis not present

## 2024-07-15 DIAGNOSIS — K8591 Acute pancreatitis with uninfected necrosis, unspecified: Secondary | ICD-10-CM | POA: Diagnosis not present

## 2024-07-15 LAB — COMPREHENSIVE METABOLIC PANEL WITH GFR
ALT: 38 U/L (ref 0–44)
AST: 36 U/L (ref 15–41)
Albumin: 4.5 g/dL (ref 3.5–5.0)
Alkaline Phosphatase: 117 U/L (ref 38–126)
Anion gap: 20 — ABNORMAL HIGH (ref 5–15)
BUN: 16 mg/dL (ref 6–20)
CO2: 20 mmol/L — ABNORMAL LOW (ref 22–32)
Calcium: 9.4 mg/dL (ref 8.9–10.3)
Chloride: 101 mmol/L (ref 98–111)
Creatinine, Ser: 0.9 mg/dL (ref 0.44–1.00)
GFR, Estimated: >60 mL/min (ref >60)
Glucose, Bld: 192 mg/dL — ABNORMAL HIGH (ref 70–99)
Potassium: 3.7 mmol/L (ref 3.5–5.1)
Sodium: 140 mmol/L (ref 135–145)
Total Bilirubin: 0.2 mg/dL (ref 0.0–1.2)
Total Protein: 7.3 g/dL (ref 6.5–8.1)

## 2024-07-15 LAB — LIPID PANEL
Cholesterol: 174 mg/dL (ref 0–200)
HDL: 56 mg/dL (ref >40)
LDL Cholesterol: 79 mg/dL (ref 0–99)
Total CHOL/HDL Ratio: 3.1 ratio
Triglycerides: 196 mg/dL — ABNORMAL HIGH (ref <150)
VLDL: 39 mg/dL (ref 0–40)

## 2024-07-15 LAB — URINALYSIS, ROUTINE W REFLEX MICROSCOPIC
Bilirubin Urine: NEGATIVE
Glucose, UA: NEGATIVE mg/dL
Hgb urine dipstick: NEGATIVE
Ketones, ur: 15 mg/dL — AB
Leukocytes,Ua: NEGATIVE
Nitrite: NEGATIVE
Protein, ur: NEGATIVE mg/dL
Specific Gravity, Urine: 1.027 (ref 1.005–1.030)
pH: 5 (ref 5.0–8.0)

## 2024-07-15 LAB — CBC
HCT: 39.1 % (ref 36.0–46.0)
Hemoglobin: 13.2 g/dL (ref 12.0–15.0)
MCH: 31.8 pg (ref 26.0–34.0)
MCHC: 33.8 g/dL (ref 30.0–36.0)
MCV: 94.2 fL (ref 80.0–100.0)
Platelets: 229 K/uL (ref 150–400)
RBC: 4.15 MIL/uL (ref 3.87–5.11)
RDW: 13.2 % (ref 11.5–15.5)
WBC: 10.1 K/uL (ref 4.0–10.5)
nRBC: 0 % (ref 0.0–0.2)

## 2024-07-15 LAB — LACTIC ACID, PLASMA
Lactic Acid, Venous: 3.4 mmol/L (ref 0.5–1.9)
Lactic Acid, Venous: 2.4 mmol/L (ref 0.5–1.9)

## 2024-07-15 LAB — HIV ANTIBODY (ROUTINE TESTING W REFLEX): HIV Screen 4th Generation wRfx: NONREACTIVE

## 2024-07-15 LAB — LIPASE, BLOOD: Lipase: >2800 U/L — ABNORMAL HIGH (ref 11–51)

## 2024-07-15 LAB — TRIGLYCERIDES: Triglycerides: 196 mg/dL — ABNORMAL HIGH (ref <150)

## 2024-07-15 MED ORDER — HYDROMORPHONE HCL 1 MG/ML IJ SOLN
0.5000 mg | INTRAMUSCULAR | Status: DC | PRN
  Administered 2024-07-15: 0.5 mg via INTRAVENOUS
  Filled 2024-07-15: qty 1

## 2024-07-15 MED ORDER — ACETAMINOPHEN 650 MG RE SUPP: 650.0000 mg | Freq: Four times a day (QID) | RECTAL | Status: DC | PRN

## 2024-07-15 MED ORDER — THIAMINE HCL 100 MG/ML IJ SOLN
100.0000 mg | Freq: Every day | INTRAMUSCULAR | Status: DC
  Administered 2024-07-15: 100 mg via INTRAVENOUS
  Filled 2024-07-15: qty 2

## 2024-07-15 MED ORDER — IOHEXOL 300 MG/ML  SOLN
100.0000 mL | Freq: Once | INTRAMUSCULAR | Status: AC | PRN
  Administered 2024-07-15: 100 mL via INTRAVENOUS

## 2024-07-15 MED ORDER — PROCHLORPERAZINE EDISYLATE 10 MG/2ML IJ SOLN
10.0000 mg | Freq: Once | INTRAMUSCULAR | Status: AC
  Administered 2024-07-15: 10 mg via INTRAVENOUS
  Filled 2024-07-15: qty 2

## 2024-07-15 MED ORDER — LORAZEPAM 1 MG PO TABS: 0.0000 mg | ORAL_TABLET | Freq: Four times a day (QID) | ORAL | Status: AC

## 2024-07-15 MED ORDER — SODIUM CHLORIDE 0.9 % IV BOLUS
1000.0000 mL | Freq: Once | INTRAVENOUS | Status: AC
  Administered 2024-07-15: 1000 mL via INTRAVENOUS

## 2024-07-15 MED ORDER — OXYCODONE HCL 5 MG PO TABS: 5.0000 mg | ORAL_TABLET | ORAL | Status: DC | PRN

## 2024-07-15 MED ORDER — HYDROMORPHONE HCL 1 MG/ML IJ SOLN
0.5000 mg | INTRAMUSCULAR | Status: AC | PRN
  Administered 2024-07-15 (×2): 1 mg via INTRAVENOUS
  Filled 2024-07-15 (×2): qty 1

## 2024-07-15 MED ORDER — AMLODIPINE BESYLATE 10 MG PO TABS
10.0000 mg | ORAL_TABLET | Freq: Every day | ORAL | Status: DC
  Administered 2024-07-15 – 2024-07-16 (×2): 10 mg via ORAL
  Filled 2024-07-15 (×2): qty 1

## 2024-07-15 MED ORDER — THIAMINE MONONITRATE 100 MG PO TABS
100.0000 mg | ORAL_TABLET | Freq: Every day | ORAL | Status: DC
  Administered 2024-07-16: 100 mg via ORAL
  Filled 2024-07-15: qty 1

## 2024-07-15 MED ORDER — ACETAMINOPHEN 325 MG PO TABS: 650.0000 mg | ORAL_TABLET | Freq: Four times a day (QID) | ORAL | Status: DC | PRN

## 2024-07-15 MED ORDER — ALBUTEROL SULFATE (2.5 MG/3ML) 0.083% IN NEBU: 2.5000 mg | INHALATION_SOLUTION | RESPIRATORY_TRACT | Status: DC | PRN

## 2024-07-15 MED ORDER — LACTATED RINGERS IV SOLN: INTRAVENOUS | Status: AC

## 2024-07-15 MED ORDER — ENOXAPARIN SODIUM 40 MG/0.4ML IJ SOSY
40.0000 mg | PREFILLED_SYRINGE | INTRAMUSCULAR | Status: DC
  Administered 2024-07-15 – 2024-07-16 (×2): 40 mg via SUBCUTANEOUS
  Filled 2024-07-15 (×2): qty 0.4

## 2024-07-15 MED ORDER — ONDANSETRON HCL 4 MG/2ML IJ SOLN
4.0000 mg | Freq: Four times a day (QID) | INTRAMUSCULAR | Status: DC | PRN
  Administered 2024-07-15: 4 mg via INTRAVENOUS
  Filled 2024-07-15: qty 2

## 2024-07-15 MED ORDER — LACTATED RINGERS IV SOLN: INTRAVENOUS | Status: DC

## 2024-07-15 MED ORDER — LABETALOL HCL 5 MG/ML IV SOLN
10.0000 mg | INTRAVENOUS | Status: DC | PRN
  Administered 2024-07-15: 10 mg via INTRAVENOUS
  Filled 2024-07-15 (×2): qty 4

## 2024-07-15 MED ORDER — PANTOPRAZOLE SODIUM 40 MG PO TBEC
40.0000 mg | DELAYED_RELEASE_TABLET | Freq: Every day | ORAL | Status: DC
  Administered 2024-07-15 – 2024-07-16 (×2): 40 mg via ORAL
  Filled 2024-07-15 (×2): qty 1

## 2024-07-15 MED ORDER — HYDRALAZINE HCL 20 MG/ML IJ SOLN
5.0000 mg | Freq: Four times a day (QID) | INTRAMUSCULAR | Status: DC | PRN
  Administered 2024-07-15: 5 mg via INTRAVENOUS
  Filled 2024-07-15: qty 1

## 2024-07-15 MED ORDER — LORAZEPAM 1 MG PO TABS: 1.0000 mg | ORAL_TABLET | Freq: Four times a day (QID) | ORAL | Status: AC

## 2024-07-15 MED ORDER — HYDROMORPHONE HCL 1 MG/ML IJ SOLN
1.0000 mg | INTRAMUSCULAR | Status: DC | PRN
  Administered 2024-07-15: 1 mg via INTRAVENOUS
  Filled 2024-07-15: qty 1

## 2024-07-15 MED ORDER — LORAZEPAM 1 MG PO TABS: 0.0000 mg | ORAL_TABLET | Freq: Two times a day (BID) | ORAL | Status: DC

## 2024-07-15 MED ORDER — LORAZEPAM 1 MG PO TABS: 1.0000 mg | ORAL_TABLET | Freq: Two times a day (BID) | ORAL | Status: DC

## 2024-07-15 MED ORDER — TRIAMTERENE-HCTZ 37.5-25 MG PO TABS
0.5000 | ORAL_TABLET | Freq: Every day | ORAL | Status: DC
  Administered 2024-07-15: 0.5 via ORAL
  Filled 2024-07-15 (×2): qty 0.5

## 2024-07-15 NOTE — Progress Notes (Addendum)
 Pt hypertensive. Pt stated she takes BP medications. Provider notified, see orders and MAR.

## 2024-07-15 NOTE — Progress Notes (Signed)
 Pt still hypertensive after giving pain medication and scheduled BP medication. Provider notified see MAR and orders.

## 2024-07-15 NOTE — Progress Notes (Addendum)
 Pt continues to be hypertensive. Provider notified and PRN hydralazine  given.

## 2024-07-15 NOTE — ED Provider Notes (Signed)
 [place] EMERGENCY DEPARTMENT AT [facility] Provider Note  CSN: [id number] Arrival date & time: 07/15/24 [id number]  Chief Complaint(s) No chief complaint on file.  HPI Latoya [name] is a 48 y.o. female with a past medical history listed below including prior Roux-en-Y surgery, pancreatitis, alcohol use disorder here for epigastric abdominal pain that began 2 hours prior to arrival.  Endorses nausea without emesis.  Pain is severe.  Admits to continued daily alcohol consumption.  Reports eating fatty foods over the past week under trip to the beach.  No other known suspicious food intake.  No diarrhea.  No other physical complaints.  The history is provided by the patient.    Past Medical History Past Medical History:  Diagnosis Date   Anemia    iron deficiency   Bleeding gastric ulcer 11/29/2002   post gastric bypass   Endometriosis 12/30/1998   GERD (gastroesophageal reflux disease)    History of blood transfusion 11/29/2002   for bleeding ulcer   Hypertension    Migraines    used to have often; now maybe 1-2/yr (07/15/12)   PAT (paroxysmal atrial tachycardia) (HCC)    occ if stressed,no tests in 17 yrs   PONV (postoperative nausea and vomiting)    UTI (lower urinary tract infection)    more when I was younger; not bad since hysterectomy   Patient Active Problem List   Diagnosis Date Noted   Hypotension 05/22/2023   Fatty liver 05/22/2023   Acute pancreatitis 05/08/2023   Obesity (BMI 30-39.9) 05/08/2023   Deficiency anemia 12/05/2022   Alcohol use disorder, mild, abuse 08/07/2022   Primary insomnia 06/06/2022   Colon cancer screening 03/10/2022   Alcoholic hepatitis without ascites 03/07/2022   Encounter for general adult medical examination with abnormal findings 03/06/2022   Primary hypertension 03/06/2022   GERD (gastroesophageal reflux disease) 10/11/2014   Migraines 10/11/2014   Home Medication(s) Prior to Admission medications   Medication  Sig Start Date End Date Taking? Authorizing Provider  aspirin  EC 81 MG tablet Take 81 mg by mouth in the morning and at bedtime. 02/13/23   [provider]  Biotin 1 MG CAPS Take 1 mg by mouth daily.    [provider]  Cyanocobalamin  (B-12 PO) Take 1 capsule by mouth daily.    [provider]  Magnesium 100 MG CAPS Take 100 mg by mouth daily.    [provider]  Multiple Vitamin (MULTIVITAMIN) tablet Take 1 tablet by mouth daily.    [provider]  omeprazole  (PRILOSEC) 20 MG capsule TAKE 1 CAPSULE BY MOUTH  DAILY Patient taking differently: Take 20 mg by mouth 2 (two) times a week. 06/17/18   Fontaine, Evalene SQUIBB, MD  traZODone  (DESYREL ) 50 MG tablet TAKE 1 TABLET BY MOUTH EVERYDAY AT BEDTIME Patient taking differently: Take 100 mg by mouth at bedtime as needed for sleep. 02/15/23   Joshua Debby CROME, MD  triamterene -hydrochlorothiazide  (MAXZIDE -25) 37.5-25 MG tablet TAKE 1/2 TABLET BY MOUTH DAILY 06/29/24   Webb, Padonda B, FNP  zolmitriptan  (ZOMIG ) 5 MG tablet Take 1 tab po for migraine headache. Do not repeat for 24 hours. Patient taking differently: Take 2.5 mg by mouth as needed for migraine. 01/16/22   Prentiss Annabella LABOR, NP  Allergies Codeine and Naltrexone   Review of Systems Review of Systems As noted in HPI  Physical Exam Vital Signs  I have reviewed the triage vital signs BP (!) 144/88   Pulse 96   Temp 98 F (36.7 C)   Resp 16   Ht 5' 7 (1.702 m)   Wt 99.8 kg   LMP 12/30/2005   SpO2 97%   BMI 34.46 kg/m   Physical Exam Vitals reviewed.  Constitutional:      General: She is not in acute distress.    Appearance: She is well-developed. She is obese. She is not diaphoretic.  HENT:     Head: Normocephalic and atraumatic.     Right Ear: External ear normal.     Left Ear: External ear normal.      Nose: Nose normal.  Eyes:     General: No scleral icterus.    Conjunctiva/sclera: Conjunctivae normal.  Neck:     Trachea: Phonation normal.  Cardiovascular:     Rate and Rhythm: Normal rate and regular rhythm.  Pulmonary:     Effort: Pulmonary effort is normal. No respiratory distress.     Breath sounds: No stridor.  Abdominal:     General: There is no distension.     Tenderness: There is abdominal tenderness in the epigastric area.  Musculoskeletal:        General: Normal range of motion.     Cervical back: Normal range of motion.  Neurological:     Mental Status: She is alert and oriented to person, place, and time.  Psychiatric:        Behavior: Behavior normal.     ED Results and Treatments Labs (all labs ordered are listed, but only abnormal results are displayed) Labs Reviewed  LIPASE, BLOOD - Abnormal; Notable for the following components:      Result Value   Lipase >2,800 (*)    All other components within normal limits  COMPREHENSIVE METABOLIC PANEL WITH GFR - Abnormal; Notable for the following components:   CO2 20 (*)    Glucose, Bld 192 (*)    Anion gap 20 (*)    All other components within normal limits  URINALYSIS, ROUTINE W REFLEX MICROSCOPIC - Abnormal; Notable for the following components:   Ketones, ur 15 (*)    All other components within normal limits  LACTIC ACID, PLASMA - Abnormal; Notable for the following components:   Lactic Acid, Venous 3.4 (*)    All other components within normal limits  CBC  LACTIC ACID, PLASMA                                                                                                                         EKG  EKG Interpretation Date/Time:  Thursday July 15 2024 02:35:09 EDT Ventricular Rate:  90 PR Interval:  194 QRS Duration:  106 QT Interval:  372 QTC Calculation: 456 R Axis:   46  Text Interpretation: Sinus rhythm RSR' in V1 or  V2, probably normal variant Confirmed by Trine Likes [phone]) on 07/15/2024  5:19:32 AM       Radiology CT ABDOMEN PELVIS W CONTRAST Result Date: 07/15/2024 CLINICAL DATA:  Acute, severe pancreatitis. Upper abdominal pain with vomiting. EXAM: CT ABDOMEN AND PELVIS WITH CONTRAST TECHNIQUE: Multidetector CT imaging of the abdomen and pelvis was performed using the standard protocol following bolus administration of intravenous contrast. RADIATION DOSE REDUCTION: This exam was performed according to the departmental dose-optimization program which includes automated exposure control, adjustment of the mA and/or kV according to patient size and/or use of iterative reconstruction technique. CONTRAST:  OMNIPAQUE  IOHEXOL  300 MG/ML  SOLN COMPARISON:  05/08/2023 FINDINGS: Lower chest: No acute abnormality. Hepatobiliary: Hepatic steatosis. No focal liver abnormality. Gallbladder appears normal. No bile duct dilatation. Pancreas: There is diffuse edema with soft tissue stranding and fluid surrounding the pancreas compatible with acute pancreatitis. No main duct dilatation, mass or loculated fluid collection. Spleen: Normal in size without focal abnormality. Adrenals/Urinary Tract: Adrenal glands are unremarkable. Kidneys are normal, without renal calculi, focal lesion, or hydronephrosis. Bladder is unremarkable. Stomach/Bowel: Postoperative changes from gastric bypass surgery. No pathologic dilatation of the large or small bowel loops. No bowel wall thickening or inflammatory changes. Vascular/Lymphatic: No significant vascular findings are present. No enlarged abdominal or pelvic lymph nodes. Reproductive: Status post hysterectomy. No adnexal masses. Other: Fluid is seen extending into the right retroperitoneal space from the pancreas. No focal fluid collections identified. No signs of pneumoperitoneum. Musculoskeletal: No acute or suspicious osseous findings. Prosthetic disc identified at L4-5. IMPRESSION: 1. Acute pancreatitis. No signs of pancreatic necrosis or loculated fluid  collection. 2. Hepatic steatosis. 3. Postoperative changes from gastric bypass surgery. Electronically Signed   By: Waddell Calk M.D.   On: 07/15/2024 05:37    Medications Ordered in ED Medications  HYDROmorphone  (DILAUDID ) injection 0.5 mg (0.5 mg Intravenous Given 07/15/24 0319)  sodium chloride  0.9 % bolus 1,000 mL (1,000 mLs Intravenous New Bag/Given 07/15/24 0318)  prochlorperazine  (COMPAZINE ) injection 10 mg (10 mg Intravenous Given 07/15/24 0410)  sodium chloride  0.9 % bolus 1,000 mL (0 mLs Intravenous Stopped 07/15/24 0612)  iohexol  (OMNIPAQUE ) 300 MG/ML solution 100 mL (100 mLs Intravenous Contrast Given 07/15/24 0453)   Procedures Procedures  (including critical care time) Medical Decision Making / ED Course   Medical Decision Making Amount and/or Complexity of Data Reviewed Labs: ordered. Decision-making details documented in ED Course. Radiology: ordered and independent interpretation performed. Decision-making details documented in ED Course. ECG/medicine tests: ordered and independent interpretation performed. Decision-making details documented in ED Course.  Risk Prescription drug management. Parenteral controlled substances. Decision regarding hospitalization.    Epigastric abdominal pain differential diagnosis considered  Provided with IV fluids, antiemetics and pain medicine.  CBC without leukocytosis or anemia.  CMP without significant electrolyte derangements.  Hyperglycemia, bicarb of 20 and anion gap of 20 -will obtain lactic acid.  Unlikely DKA. No biliary obstruction.  Lipase greater than 2800 consistent with acute pancreatitis.  CT scan to evaluate for necrosis and to rule out other intra-abdominal inflammatory/infectious process, bowel obstruction or an internal hernia ordered.  CT is consistent with pancreatitis.  No other acute findings noted.  Additional IV fluids ordered.  Will discuss admission with hospitalist service for continued IV hydration     Final Clinical Impression(s) / ED Diagnoses Final diagnoses:  Acute pancreatitis without infection or necrosis, unspecified pancreatitis type    This chart was dictated using voice recognition software.  Despite best efforts to proofread,  errors  can occur which can change the documentation meaning.    Trine Raynell Moder, MD 07/15/24 [phone]

## 2024-07-15 NOTE — Plan of Care (Signed)
  Problem: Clinical Measurements: Goal: Will remain free from infection Outcome: Progressing   Problem: Activity: Goal: Risk for activity intolerance will decrease Outcome: Progressing   Problem: Nutrition: Goal: Adequate nutrition will be maintained Outcome: Progressing   Problem: Pain Managment: Goal: General experience of comfort will improve and/or be controlled Outcome: Progressing   Problem: Skin Integrity: Goal: Risk for impaired skin integrity will decrease Outcome: Progressing

## 2024-07-15 NOTE — ED Notes (Signed)
 I have just given report to Arlyn, Charity fundraiser at [facility]. [facility] is here now (just arrived) for tx to [facility].

## 2024-07-15 NOTE — H&P (Signed)
 History and Physical  Latoya [name] FMW:[id number] DOB: 08-27-1976 DOA: 07/15/2024  PCP: Joshua Debby CROME, MD   Chief Complaint: Abdominal pain, nausea  HPI: Latoya [name] is a 48 y.o. female with medical history significant for hypertension, GERD, daily alcohol use being admitted to the hospital with recurrent alcohol associated pancreatitis.  She was admitted to the hospital last year after she had gone on vacation and drank excessively, she just returned 2 days ago from a girls trip at the [employer] where she admits to drinking quite heavily.  This morning about 1 AM, she had severe epigastric burning abdominal pain, associated nonbilious nonbloody emesis.  Denies any fevers, chills, diarrhea, chest pain, shortness of breath or any other concerns.  Seems very similar to when she had pancreatitis last year.  Review of Systems: Please see HPI for pertinent positives and negatives. A complete 10 system review of systems are otherwise negative.  Past Medical History:  Diagnosis Date   Anemia    iron deficiency   Bleeding gastric ulcer 11/29/2002   post gastric bypass   Endometriosis 12/30/1998   GERD (gastroesophageal reflux disease)    History of blood transfusion 11/29/2002   for bleeding ulcer   Hypertension    Migraines    used to have often; now maybe 1-2/yr (07/15/12)   PAT (paroxysmal atrial tachycardia) (HCC)    occ if stressed,no tests in 17 yrs   PONV (postoperative nausea and vomiting)    UTI (lower urinary tract infection)    more when I was younger; not bad since hysterectomy   Past Surgical History:  Procedure Laterality Date   ANTERIOR LUMBAR FUSION  07/15/12   abdominal exposure   ANTERIOR LUMBAR FUSION  07/15/2012   Procedure: ANTERIOR LUMBAR FUSION 1 LEVEL;  Surgeon: Donaciano Sprang, MD;  Location: [facility];  Service: Orthopedics;  Laterality: N/A;  total disc replacement versus ALIF L4-5   CARPAL TUNNEL RELEASE  2001?   right   CESAREAN SECTION  1996    PELVIC LAPAROSCOPY  2000   endometriosis   ROUX-EN-Y GASTRIC BYPASS  2003   SALPINGOOPHORECTOMY  2007; 2009   left; right   TUBAL LIGATION  03   VAGINAL HYSTERECTOMY  2007   LAVH   Social History:  reports that she has been smoking e-cigarettes and cigarettes. She started smoking about 5 years ago. She has a 5 pack-year smoking history. She has been exposed to tobacco smoke. She has never used smokeless tobacco. She reports current alcohol use of about 10.0 standard drinks of alcohol per week. She reports that she does not use drugs.  Allergies  Allergen Reactions   Codeine Itching   Naltrexone  Nausea Only    Family History  Problem Relation Age of Onset   Diabetes Mother    Hypertension Mother    Stroke Mother        mini   Lung cancer Mother    Heart failure Mother    Hypertension Father    Cancer Father        Liver   Breast cancer Maternal Aunt 26     Prior to Admission medications   Medication Sig Start Date End Date Taking? Authorizing Provider  aspirin  EC 81 MG tablet Take 81 mg by mouth in the morning and at bedtime. 02/13/23   [provider]  Biotin 1 MG CAPS Take 1 mg by mouth daily.    [provider]  Cyanocobalamin  (B-12 PO) Take 1 capsule by mouth daily.  [provider]  Magnesium 100 MG CAPS Take 100 mg by mouth daily.    [provider]  Multiple Vitamin (MULTIVITAMIN) tablet Take 1 tablet by mouth daily.    [provider]  omeprazole  (PRILOSEC) 20 MG capsule TAKE 1 CAPSULE BY MOUTH  DAILY Patient taking differently: Take 20 mg by mouth 2 (two) times a week. 06/17/18   Fontaine, Evalene SQUIBB, MD  traZODone  (DESYREL ) 50 MG tablet TAKE 1 TABLET BY MOUTH EVERYDAY AT BEDTIME Patient taking differently: Take 100 mg by mouth at bedtime as needed for sleep. 02/15/23   Joshua Debby CROME, MD  triamterene -hydrochlorothiazide  (MAXZIDE -25) 37.5-25 MG tablet TAKE 1/2 TABLET BY MOUTH DAILY 06/29/24   Webb, Padonda B, FNP   zolmitriptan  (ZOMIG ) 5 MG tablet Take 1 tab po for migraine headache. Do not repeat for 24 hours. Patient taking differently: Take 2.5 mg by mouth as needed for migraine. 01/16/22   Prentiss Annabella LABOR, NP    Physical Exam: BP (!) 171/97 (BP Location: Left Arm)   Pulse 96   Temp 98.4 F (36.9 C) (Oral)   Resp 20   Ht 5' 7 (1.702 m)   Wt 99.8 kg   LMP 12/30/2005   SpO2 98%   BMI 34.46 kg/m  General:  Alert, oriented, calm, in no acute distress  Eyes: EOMI, clear conjuctivae, white sclerea Neck: supple, no masses, trachea mildline  Cardiovascular: RRR, no murmurs or rubs, no peripheral edema  Respiratory: clear to auscultation bilaterally, no wheezes, no crackles  Abdomen: soft, mild epigastric abdominal tenderness, nondistended, normal bowel tones heard  Skin: dry, no rashes  Musculoskeletal: no joint effusions, normal range of motion  Psychiatric: appropriate affect, normal speech  Neurologic: extraocular muscles intact, clear speech, moving all extremities with intact sensorium         Labs on Admission:  Basic Metabolic Panel: Recent Labs  Lab 07/15/24 0241  NA 140  K 3.7  CL 101  CO2 20*  GLUCOSE 192*  BUN 16  CREATININE 0.90  CALCIUM 9.4   Liver Function Tests: Recent Labs  Lab 07/15/24 0241  AST 36  ALT 38  ALKPHOS 117  BILITOT 0.2  PROT 7.3  ALBUMIN 4.5   Recent Labs  Lab 07/15/24 0241  LIPASE >2,800*   No results for input(s): AMMONIA in the last 168 hours. CBC: Recent Labs  Lab 07/15/24 0241  WBC 10.1  HGB 13.2  HCT 39.1  MCV 94.2  PLT 229   Cardiac Enzymes: No results for input(s): CKTOTAL, CKMB, CKMBINDEX, TROPONINI in the last 168 hours. BNP (last 3 results) No results for input(s): BNP in the last 8760 hours.  ProBNP (last 3 results) No results for input(s): PROBNP in the last 8760 hours.  CBG: No results for input(s): GLUCAP in the last 168 hours.  Radiological Exams on Admission: CT ABDOMEN PELVIS W  CONTRAST Result Date: 07/15/2024 CLINICAL DATA:  Acute, severe pancreatitis. Upper abdominal pain with vomiting. EXAM: CT ABDOMEN AND PELVIS WITH CONTRAST TECHNIQUE: Multidetector CT imaging of the abdomen and pelvis was performed using the standard protocol following bolus administration of intravenous contrast. RADIATION DOSE REDUCTION: This exam was performed according to the departmental dose-optimization program which includes automated exposure control, adjustment of the mA and/or kV according to patient size and/or use of iterative reconstruction technique. CONTRAST:  OMNIPAQUE  IOHEXOL  300 MG/ML  SOLN COMPARISON:  05/08/2023 FINDINGS: Lower chest: No acute abnormality. Hepatobiliary: Hepatic steatosis. No focal liver abnormality. Gallbladder appears normal. No bile duct dilatation.  Pancreas: There is diffuse edema with soft tissue stranding and fluid surrounding the pancreas compatible with acute pancreatitis. No main duct dilatation, mass or loculated fluid collection. Spleen: Normal in size without focal abnormality. Adrenals/Urinary Tract: Adrenal glands are unremarkable. Kidneys are normal, without renal calculi, focal lesion, or hydronephrosis. Bladder is unremarkable. Stomach/Bowel: Postoperative changes from gastric bypass surgery. No pathologic dilatation of the large or small bowel loops. No bowel wall thickening or inflammatory changes. Vascular/Lymphatic: No significant vascular findings are present. No enlarged abdominal or pelvic lymph nodes. Reproductive: Status post hysterectomy. No adnexal masses. Other: Fluid is seen extending into the right retroperitoneal space from the pancreas. No focal fluid collections identified. No signs of pneumoperitoneum. Musculoskeletal: No acute or suspicious osseous findings. Prosthetic disc identified at L4-5. IMPRESSION: 1. Acute pancreatitis. No signs of pancreatic necrosis or loculated fluid collection. 2. Hepatic steatosis. 3. Postoperative changes  from gastric bypass surgery. Electronically Signed   By: Waddell Calk M.D.   On: 07/15/2024 05:37   Assessment/Plan Taia Bramlett is a 48 y.o. female with medical history significant for hypertension, GERD, daily alcohol use being admitted to the hospital with recurrent alcohol associated pancreatitis.   Acute pancreatitis-alcoholic, without complications such as necrosis, fluid collection, cyst etc.  Lipase elevated greater than 2800, imaging consistent with pancreatitis.  Triglycerides not significantly elevated. -Inpatient admission -Sips of clears -Copious IV fluids -Pain and nausea medication as needed  Alcohol use disorder-patient drinks up to a bottle of wine daily, she was counseled to completely cease the abuse of alcohol or else she would be at risk for development of chronic pancreatitis or other serious complications -Thiamine , folate, multivitamin -Ativan  per CIWA protocol, note that she had no evidence of alcohol withdrawal on her prior admissions  Hypertension-continue home Maxide, as well as IV hydralazine  for uncontrolled blood pressure  DVT prophylaxis: Lovenox      Code Status: Full Code  Consults called: None  Admission status: The appropriate patient status for this patient is INPATIENT. Inpatient status is judged to be reasonable and necessary in order to provide the required intensity of service to ensure the patient's safety. The patient's presenting symptoms, physical exam findings, and initial radiographic and laboratory data in the context of their chronic comorbidities is felt to place them at high risk for further clinical deterioration. Furthermore, it is not anticipated that the patient will be medically stable for discharge from the hospital within 2 midnights of admission.    I certify that at the point of admission it is my clinical judgment that the patient will require inpatient hospital care spanning beyond 2 midnights from the point of admission due  to high intensity of service, high risk for further deterioration and high frequency of surveillance required  Time spent: 48 minutes  Celisse Ciulla CHRISTELLA Gail MD [facility] Pager [phone]  If 7PM-7AM, please contact night-coverage [email or web address] Password [facility]  07/15/2024, 12:33 PM

## 2024-07-15 NOTE — Progress Notes (Signed)
 Pt received new order for BP medication. BP medication given. BP reassess and hypertensive 178/101. Pt complaining of 7/10 abdominal pain. PRN pain medication given. Provider notified of blood pressure. No additional orders at this time. Pt a&ox4. Rise and fall of pts chest observed. Bed in the lowest position, wheels locked. Call light within reach. Family at the bedside and no additional orders at this time.

## 2024-07-15 NOTE — ED Triage Notes (Signed)
 Upper abdominal pain, vomiting, SOB starting one hour ago. States it feels exactly like it did when she had pancreatitis last year.

## 2024-07-15 NOTE — Plan of Care (Signed)
 [facility] medical telemetry bed transfer:  48 year old female medical history of prior Roux-en-Y surgery, recurrent alcoholic pancreatitis , alcohol use diso, essential hypertension rder, anemia of chronic disease, history of left ankle fracture presented emergency department complaining of abdominal pain that started 2 hours ago prior arrival to the ED. Pain is severe. Admits to continued daily alcohol consumption. Reports eating fatty foods over the past week under trip to the beach. No other known suspicious food intake. No diarrhea.  Patient reported she almost drinks 1 bottle of wine on daily basis.  At presentation to ED patient found hypertensive however blood pressure has been improved with pain control otherwise hemodynamically stable.  Elevated lipase above 2800. CMP showed low bicarb 20 and elevated anion gap 20.  Normal AST/ALT/ALP and bilirubin level. CBC unremarkable. Lactic acid 3.4  CT abdomen pelvis showing acute pancreatitis.  No sign of pancreatic necrosis or loculated fluid collection.  Hepatic steatosis.  Postoperative changes from gastric bypass surgery.  Requested Dr. Trine to start CIWA protocol and Ativan  as needed.  Also checking triglyceride level if it is above 500 range need to insulin drip in that case.  In the ED patient has been received 2 L of NS bolus, Dilaudid .  Hospitalist has been consulted for management of Acute alcoholic pancreatitis, anion gap metabolic acidosis, type B lactic acidosis in the setting of alcohol use.

## 2024-07-15 NOTE — ED Notes (Signed)
 Called [facility] to transport patient to [facility] rm# [id number]

## 2024-07-16 DIAGNOSIS — K852 Alcohol induced acute pancreatitis without necrosis or infection: Secondary | ICD-10-CM | POA: Diagnosis not present

## 2024-07-16 LAB — CBC
HCT: 36.2 % (ref 36.0–46.0)
Hemoglobin: 11.9 g/dL — ABNORMAL LOW (ref 12.0–15.0)
MCH: 32.2 pg (ref 26.0–34.0)
MCHC: 32.9 g/dL (ref 30.0–36.0)
MCV: 98.1 fL (ref 80.0–100.0)
Platelets: 194 K/uL (ref 150–400)
RBC: 3.69 MIL/uL — ABNORMAL LOW (ref 3.87–5.11)
RDW: 13.8 % (ref 11.5–15.5)
WBC: 8.4 K/uL (ref 4.0–10.5)
nRBC: 0 % (ref 0.0–0.2)

## 2024-07-16 LAB — COMPREHENSIVE METABOLIC PANEL WITH GFR
ALT: 25 U/L (ref 0–44)
AST: 24 U/L (ref 15–41)
Albumin: 3.7 g/dL (ref 3.5–5.0)
Alkaline Phosphatase: 93 U/L (ref 38–126)
Anion gap: 11 (ref 5–15)
BUN: 8 mg/dL (ref 6–20)
CO2: 27 mmol/L (ref 22–32)
Calcium: 9 mg/dL (ref 8.9–10.3)
Chloride: 101 mmol/L (ref 98–111)
Creatinine, Ser: 0.83 mg/dL (ref 0.44–1.00)
GFR, Estimated: >60 mL/min (ref >60)
Glucose, Bld: 135 mg/dL — ABNORMAL HIGH (ref 70–99)
Potassium: 3.7 mmol/L (ref 3.5–5.1)
Sodium: 139 mmol/L (ref 135–145)
Total Bilirubin: 1.1 mg/dL (ref 0.0–1.2)
Total Protein: 6.9 g/dL (ref 6.5–8.1)

## 2024-07-16 LAB — LIPASE, BLOOD: Lipase: 285 U/L — ABNORMAL HIGH (ref 11–51)

## 2024-07-16 MED ORDER — LACTATED RINGERS IV SOLN: INTRAVENOUS | Status: AC

## 2024-07-16 MED ORDER — ZOLPIDEM TARTRATE 5 MG PO TABS
5.0000 mg | ORAL_TABLET | Freq: Every evening | ORAL | Status: DC | PRN
  Administered 2024-07-16: 5 mg via ORAL
  Filled 2024-07-16: qty 1

## 2024-07-16 MED ORDER — ASPIRIN-ACETAMINOPHEN-CAFFEINE 250-250-65 MG PO TABS
1.0000 | ORAL_TABLET | Freq: Three times a day (TID) | ORAL | Status: DC | PRN
  Administered 2024-07-16: 1 via ORAL
  Filled 2024-07-16 (×2): qty 1

## 2024-07-16 NOTE — Progress Notes (Signed)
 PROGRESS NOTE  Latoya [name]  DOB: 1976/06/29  PCP: Joshua Debby CROME, MD FMW:[id number]  DOA: 07/15/2024  LOS: 1 day  Hospital Day: 2  Brief narrative: Latoya [name] is a 48 y.o. female with PMH significant for chronic alcoholism, h/o alcoholic pancreatitis, chronic smoking, obesity s/p gastric bypass surgery, h/o gastric ulcer, HTN, migraine. 7/17, patient presented to the ED with complaint of severe abdominal pain, vomiting for about an hour.  Last year, she had an episode of alcoholic pancreatitis after a day of binge drinking.  She just returned back from a trip 2 days ago and endorses heavy drinking while there.  Her symptoms felt similar to last episode and hence presented to the ED with concern of pancreatitis.   Drinks about 10 standard drinks per week and sometimes been drinking Denies any drug use  In the ED, patient was afebrile, blood pressure was initially elevated to 184/108 while in pain, breathing on room air Initial labs with unremarkable CBC, chemistry with lipase >2800, triglyceride 196, lactic acid 3.4 EKG showed sinus rhythm at 90 bpm, QTc 456 ms CT abdomen pelvis showed acute pancreatitis without evidence of necrosis or fluid collection.  Hepatic steatosis  Subjective: Patient was seen and examined this morning. Pleasant middle-aged Caucasian female. Propped up in bed.  Not in distress.  Feels better.  Wanted to try oral intake. Chart reviewed Overnight, afebrile, heart rate in 80s and 90s, blood pressure in 160s to 180s, breathing room air Labs this morning with lipase level significantly better at 285  Assessment and plan: Acute alcoholic pancreatitis Presented with severe abdomen pain, vomiting after 2 days of binge drinking Lipase level initially elevated over 2800 CT abdomen showed pancreatitis without necrosis or fluid collection Triglyceride level was not significantly elevated Currently on IV fluid, PPI, IV analgesics, IV antiemetics, bowel  rest Lipase level is significantly better at 285 this morning, Symptoms improving.  Wants to eat.  Full liquid diet ordered I will continue conservative management for next 24 hours.  If able to tolerate full liquid diet, can advance diet to soft tomorrow morning.  Ordered.  Continue IV fluid at a reduced rate. Repeat lipase in a.m. If continues to improve, hopefully home tomorrow Recent Labs  Lab 07/15/24 0241 07/16/24 0530  LIPASE >2,800* 285*   Lactic acidosis WBC count normal.  No evidence of infection, blood pressure was stable.  But lactic acid was elevated to 3.4.,   Likely from the use of alcohol  trending down.  Repeat lactic acid level tomorrow Recent Labs  Lab 07/15/24 0241 07/15/24 0440 07/15/24 0628 07/16/24 0530  WBC 10.1  --   --  8.4  LATICACIDVEN  --  3.4* 2.4*  --    Chronic alcoholism Patient drinks up to a bottle of wine daily, she was counseled to completely cease the abuse of alcohol or else she would be at risk for development of chronic pancreatitis or other serious complications Currently on thiamine , folate, multivitamin Continue PRN Ativan  per CIWA protocol,    Hypertension PTA meds- amlodipine  10 mg daily, triamterene  37.5 mg daily, HCTZ 25 mg daily Continue amlodipine .  I will hold triamterene -HCTZ while getting IV fluid IV labetalol  as needed  Migraine As needed Zomig    Obesity 1 Body mass index is 34.46 kg/m. Patient has been advised to make an attempt to improve diet and exercise patterns to aid in weight loss.    Mobility: Encourage ambulation  Goals of care   Code Status: Full Code  DVT prophylaxis:  enoxaparin  (LOVENOX ) injection 40 mg Start: 07/15/24 2200   Antimicrobials: None Fluid: LR at 75 mL/h Consultants: None Family Communication: None at bedside  Status: Inpatient Level of care: Telemetry currently.  Can change level of care to Med-Surg   Patient is from: Home Needs to continue in-hospital care: Conservative  management of pancreatitis Anticipated d/c to: If continues to improve, can consider discharge home tomorrow    Diet:  Diet Order             DIET SOFT Fluid consistency: Thin  Diet effective 0500           Diet full liquid Fluid consistency: Thin  Diet effective now                   Scheduled Meds:  amLODipine   10 mg Oral Daily   enoxaparin  (LOVENOX ) injection  40 mg Subcutaneous Q24H   LORazepam   1 mg Oral Q6H   Or   LORazepam   0-4 mg Oral Q6H   [START ON 07/17/2024] LORazepam   1 mg Oral Q12H   Or   [START ON 07/17/2024] LORazepam   0-4 mg Oral Q12H   pantoprazole   40 mg Oral Daily   thiamine   100 mg Oral Daily   Or   thiamine   100 mg Intravenous Daily    PRN meds: acetaminophen  **OR** acetaminophen , albuterol , HYDROmorphone  (DILAUDID ) injection, labetalol , ondansetron  (ZOFRAN ) IV, oxyCODONE    Infusions:   lactated ringers       Antimicrobials: Anti-infectives (From admission, onward)    None       Objective: Vitals:   07/15/24 2305 07/16/24 0607  BP: (!) 162/91 (!) 163/86  Pulse: 93 84  Resp: 18 18  Temp: 98.5 F (36.9 C) 98.6 F (37 C)  SpO2: 99% 99%    Intake/Output Summary (Last 24 hours) at 07/16/2024 1006 Last data filed at 07/16/2024 0800 Gross per 24 hour  Intake 4264.16 ml  Output --  Net 4264.16 ml   Filed Weights   07/15/24 0231  Weight: 99.8 kg   Weight change:  Body mass index is 34.46 kg/m.   Physical Exam: General exam: Pleasant, middle-aged.  Obese Skin: No rashes, lesions or ulcers. HEENT: Atraumatic, normocephalic, no obvious bleeding Lungs: Clear to auscultation bilaterally,  CVS: S1, S2, no murmur,   GI/Abd: Soft, mild epigastric tenderness, nondistended, bowel sound present,   CNS: Alert, awake, oriented x 3 Psychiatry: Mood appropriate Extremities: No pedal edema, no calf tenderness,   Data Review: I have personally reviewed the laboratory data and studies available.  F/u labs ordered Unresulted Labs (From  admission, onward)     Start     Ordered   07/17/24 0500  Basic metabolic panel with GFR  Tomorrow morning,   R        07/16/24 0724   07/17/24 0500  Lactic acid, plasma  (Lactic Acid)  Tomorrow morning,   R        07/16/24 0724   07/17/24 0500  Lipase, blood  Tomorrow morning,   R        07/16/24 0724            Signed, Chapman Rota, MD [facility] 07/16/2024

## 2024-07-16 NOTE — Progress Notes (Signed)
   07/16/24 1319  OTHER  Substance Abuse Screening unable to be completed due to:  Patient Refused  Substance Abuse Education Offered Yes

## 2024-07-16 NOTE — Plan of Care (Signed)

## 2024-07-16 NOTE — Plan of Care (Signed)

## 2024-07-16 NOTE — Progress Notes (Signed)
   07/16/24 1255  TOC Brief Assessment  Insurance and Status Reviewed  Patient has primary care physician Yes Bonney, Debby CROME, MD)  Home environment has been reviewed Home  Prior level of function: Independent  Prior/Current Home Services No current home services  Social Drivers of Health Review SDOH reviewed needs interventions  Readmission risk has been reviewed Yes  Transition of care needs transition of care needs identified, TOC will continue to follow   TOC will follow for substance abuse resources

## 2024-07-16 NOTE — TOC Initial Note (Signed)
 Transition of Care [facility]) - Initial/Assessment Note    Patient Details  Name: Latoya [name] MRN: [id number] Date of Birth: [birth year]  [facility] CM/SW Contact:    Latoya Latoya Agar, RN Phone Number:[phone]  07/16/2024, 1:21 PM  Clinical Narrative:                 TOC following patient with present ETOH abuse. CM at bedside introduces self explained role asking patient if she would like ETOH abuse resources/ education. Patient refused. No other needs noted at this time.     Barriers to Discharge: No Barriers Identified, Continued Medical Work up   Patient Goals and CMS Choice            Expected Discharge Plan and Services                                              Prior Living Arrangements/Services                       Activities of Daily Living   ADL Screening (condition at time of admission) Independently performs ADLs?: Yes (appropriate for developmental age) Is the patient deaf or have difficulty hearing?: No Does the patient have difficulty seeing, even when wearing glasses/contacts?: No Does the patient have difficulty concentrating, remembering, or making decisions?: No  Permission Sought/Granted                  Emotional Assessment              Admission diagnosis:  Acute alcoholic pancreatitis [K85.20] Pancreatitis, alcoholic, acute [K85.20] Acute pancreatitis without infection or necrosis, unspecified pancreatitis type [K85.90] Patient Active Problem List   Diagnosis Date Noted   Acute alcoholic pancreatitis 07/15/2024   Pancreatitis, alcoholic, acute 07/15/2024   Hypotension 05/22/2023   Fatty liver 05/22/2023   Acute pancreatitis 05/08/2023   Obesity (BMI 30-39.9) 05/08/2023   Deficiency anemia 12/05/2022   Alcohol use disorder, mild, abuse 08/07/2022   Primary insomnia 06/06/2022   Colon cancer screening 03/10/2022   Alcoholic hepatitis without ascites 03/07/2022   Encounter for general adult  medical examination with abnormal findings 03/06/2022   Primary hypertension 03/06/2022   GERD (gastroesophageal reflux disease) 10/11/2014   Migraines 10/11/2014   PCP:  Latoya [name] LITTIE, MD Pharmacy:   [id number] [name], [place] - [address] [place]oya [name] [place] [address] Phone: [phone] Fax: [phone]     Social Drivers of Health (SDOH) Social History: SDOH Screenings   Food Insecurity: No Food Insecurity (07/15/2024)  Housing: Low Risk  (07/15/2024)  Transportation Needs: No Transportation Needs (07/15/2024)  Utilities: Not At Risk (07/15/2024)  Depression (PHQ2-9): Low Risk  (01/02/2024)  Tobacco Use: High Risk (07/15/2024)   SDOH Interventions:     Readmission Risk Interventions    05/09/2023   11:30 AM  Readmission Risk Prevention Plan  Transportation Screening Complete  PCP or Specialist Appt within 5-7 Days Complete  Home Care Screening Complete  Medication Review (RN CM) Complete

## 2024-07-17 DIAGNOSIS — K859 Acute pancreatitis without necrosis or infection, unspecified: Secondary | ICD-10-CM

## 2024-07-17 LAB — BASIC METABOLIC PANEL WITH GFR
Anion gap: 13 (ref 5–15)
BUN: 7 mg/dL (ref 6–20)
CO2: 23 mmol/L (ref 22–32)
Calcium: 9.2 mg/dL (ref 8.9–10.3)
Chloride: 102 mmol/L (ref 98–111)
Creatinine, Ser: 0.79 mg/dL (ref 0.44–1.00)
GFR, Estimated: >60 mL/min (ref >60)
Glucose, Bld: 117 mg/dL — ABNORMAL HIGH (ref 70–99)
Potassium: 3.6 mmol/L (ref 3.5–5.1)
Sodium: 138 mmol/L (ref 135–145)

## 2024-07-17 LAB — LACTIC ACID, PLASMA: Lactic Acid, Venous: 1.1 mmol/L (ref 0.5–1.9)

## 2024-07-17 LAB — LIPASE, BLOOD: Lipase: 86 U/L — ABNORMAL HIGH (ref 11–51)

## 2024-07-17 MED ORDER — OMEPRAZOLE 20 MG PO CPDR: 20.0000 mg | DELAYED_RELEASE_CAPSULE | Freq: Every day | ORAL | 0 refills | Status: DC

## 2024-07-17 MED ORDER — VITAMIN B-1 100 MG PO TABS: 100.0000 mg | ORAL_TABLET | Freq: Every day | ORAL | Status: AC

## 2024-07-17 NOTE — Discharge Summary (Signed)
 Physician Discharge Summary  Latoya [name] FMW:[id number] DOB: Feb 16, 1976 DOA: 07/15/2024  PCP: Joshua Debby CROME, MD  Admit date: 07/15/2024 Discharge date: 07/17/2024  Admitted From: home Disposition: home Recommendations for Outpatient Follow-up:  Follow up with PCP in 1-2 weeks Please obtain BMP/CBC in one week  Home Health: None  Equipment/Devices: None Discharge Condition: Stable CODE STATUS full code Diet recommendation: Cardiac  Brief/Interim Summary: 48 y.o. female with PMH significant for chronic alcoholism, h/o alcoholic pancreatitis, chronic smoking, obesity s/p gastric bypass surgery, h/o gastric ulcer, HTN, migraine. 7/17, patient presented to the ED with complaint of severe abdominal pain, vomiting for about an hour.  Last year, she had an episode of alcoholic pancreatitis after a day of binge drinking.  She just returned back from a trip 2 days ago and endorses heavy drinking while there.  Her symptoms felt similar to last episode and hence presented to the ED with concern of pancreatitis.   Drinks about 10 standard drinks per week and sometimes been drinking Denies any drug use   In the ED, patient was afebrile, blood pressure was initially elevated to 184/108 while in pain, breathing on room air Initial labs with unremarkable CBC, chemistry with lipase >2800, triglyceride 196, lactic acid 3.4 EKG showed sinus rhythm at 90 bpm, QTc 456 ms CT abdomen pelvis showed acute pancreatitis without evidence of necrosis or fluid collection.  Hepatic steatosis    Discharge Diagnoses:  Principal Problem:   Acute alcoholic pancreatitis Active Problems:   Pancreatitis, alcoholic, acute    Acute alcoholic pancreatitis Presented with severe abdomen pain, vomiting after 2 days of binge drinking Lipase level initially elevated over 2800 CT abdomen showed pancreatitis without necrosis or fluid collection Triglyceride level was not significantly elevated He was treated with IV  fluids Protonix  antiemetics and IV analgesics and bowel rest.  Lipase improved to the 80s on the day of discharge.  She was able to tolerate regular diet prior to discharge.   Lactic acidosis WBC count normal.  No evidence of infection, blood pressure was stable.  But lactic acid was elevated to 3.4.,   Likely from the use of alcohol    Chronic alcoholism Patient drinks up to a bottle of wine daily, she was counseled to completely cease the abuse of alcohol or else she would be at risk for development of chronic pancreatitis or other serious complications  on thiamine , folate, multivitamin   Hypertension continue triamterene    Migraine As needed Zomig     Obesity 1 Body mass index is 34.46 kg/m. Patient has been advised to make an attempt to improve diet and exercise patterns to aid in weight loss.    Estimated body mass index is 34.46 kg/m as calculated from the following:   Height as of this encounter: 5' 7 (1.702 m).   Weight as of this encounter: 99.8 kg.  Discharge Instructions  Discharge Instructions     Diet - low sodium heart healthy   Complete by: As directed    Increase activity slowly   Complete by: As directed       Allergies as of 07/17/2024       Reactions   Codeine Itching   Naltrexone  Nausea Only        Medication List     TAKE these medications    Biotin 1 MG Caps Take 1 mg by mouth daily.   Magnesium 100 MG Caps Take 100 mg by mouth daily.   multivitamin tablet Take 1 tablet by mouth daily.  omeprazole  20 MG capsule Commonly known as: PRILOSEC Take 1 capsule (20 mg total) by mouth daily. What changed: when to take this   thiamine  100 MG tablet Commonly known as: Vitamin B-1 Take 1 tablet (100 mg total) by mouth daily.   triamterene -hydrochlorothiazide  37.5-25 MG tablet Commonly known as: MAXZIDE -25 TAKE 1/2 TABLET BY MOUTH DAILY   zolmitriptan  5 MG tablet Commonly known as: ZOMIG  Take 1 tab po for migraine headache. Do not  repeat for 24 hours. What changed:  how much to take how to take this when to take this reasons to take this additional instructions        Follow-up Information     Joshua Debby CROME, MD Follow up.   Specialty: Internal Medicine Contact information: [address] [place] [address] [phone]                Allergies  Allergen Reactions   Codeine Itching   Naltrexone  Nausea Only    Consultations: None   Procedures/Studies: CT ABDOMEN PELVIS W CONTRAST Result Date: 07/15/2024 CLINICAL DATA:  Acute, severe pancreatitis. Upper abdominal pain with vomiting. EXAM: CT ABDOMEN AND PELVIS WITH CONTRAST TECHNIQUE: Multidetector CT imaging of the abdomen and pelvis was performed using the standard protocol following bolus administration of intravenous contrast. RADIATION DOSE REDUCTION: This exam was performed according to the departmental dose-optimization program which includes automated exposure control, adjustment of the mA and/or kV according to patient size and/or use of iterative reconstruction technique. CONTRAST:  OMNIPAQUE  IOHEXOL  300 MG/ML  SOLN COMPARISON:  05/08/2023 FINDINGS: Lower chest: No acute abnormality. Hepatobiliary: Hepatic steatosis. No focal liver abnormality. Gallbladder appears normal. No bile duct dilatation. Pancreas: There is diffuse edema with soft tissue stranding and fluid surrounding the pancreas compatible with acute pancreatitis. No main duct dilatation, mass or loculated fluid collection. Spleen: Normal in size without focal abnormality. Adrenals/Urinary Tract: Adrenal glands are unremarkable. Kidneys are normal, without renal calculi, focal lesion, or hydronephrosis. Bladder is unremarkable. Stomach/Bowel: Postoperative changes from gastric bypass surgery. No pathologic dilatation of the large or small bowel loops. No bowel wall thickening or inflammatory changes. Vascular/Lymphatic: No significant vascular findings are present. No  enlarged abdominal or pelvic lymph nodes. Reproductive: Status post hysterectomy. No adnexal masses. Other: Fluid is seen extending into the right retroperitoneal space from the pancreas. No focal fluid collections identified. No signs of pneumoperitoneum. Musculoskeletal: No acute or suspicious osseous findings. Prosthetic disc identified at L4-5. IMPRESSION: 1. Acute pancreatitis. No signs of pancreatic necrosis or loculated fluid collection. 2. Hepatic steatosis. 3. Postoperative changes from gastric bypass surgery. Electronically Signed   By: Waddell Calk M.D.   On: 07/15/2024 05:37   (Echo, Carotid, EGD, Colonoscopy, ERCP)    Subjective: Ambulating in the room tolerated breakfast anxious to go home no complaints of pain  Discharge Exam: Vitals:   07/16/24 2049 07/16/24 2144  BP: (!) 163/83 105/66  Pulse: 91 95  Resp: 18 18  Temp: 98.4 F (36.9 C) 98.8 F (37.1 C)  SpO2: 100% 95%   Vitals:   07/16/24 1042 07/16/24 1450 07/16/24 2049 07/16/24 2144  BP: (!) 163/91 (!) 156/85 (!) 163/83 105/66  Pulse:  87 91 95  Resp:  20 18 18   Temp:  98.2 F (36.8 C) 98.4 F (36.9 C) 98.8 F (37.1 C)  TempSrc:  Oral Oral Oral  SpO2:  98% 100% 95%  Weight:      Height:        General: Pt is alert, awake,  not in acute distress Cardiovascular: RRR, S1/S2 +, no rubs, no gallops Respiratory: CTA bilaterally, no wheezing, no rhonchi Abdominal: Soft, NT, ND, bowel sounds + Extremities: no edema, no cyanosis    The results of significant diagnostics from this hospitalization (including imaging, microbiology, ancillary and laboratory) are listed below for reference.     Microbiology: No results found for this or any previous visit (from the past 240 hours).   Labs: BNP (last 3 results) No results for input(s): BNP in the last 8760 hours. Basic Metabolic Panel: Recent Labs  Lab 07/15/24 0241 07/16/24 0530 07/17/24 0609  NA 140 139 138  K 3.7 3.7 3.6  CL 101 101 102  CO2 20* 27  23  GLUCOSE 192* 135* 117*  BUN 16 8 7   CREATININE 0.90 0.83 0.79  CALCIUM 9.4 9.0 9.2   Liver Function Tests: Recent Labs  Lab 07/15/24 0241 07/16/24 0530  AST 36 24  ALT 38 25  ALKPHOS 117 93  BILITOT 0.2 1.1  PROT 7.3 6.9  ALBUMIN 4.5 3.7   Recent Labs  Lab 07/15/24 0241 07/16/24 0530 07/17/24 0609  LIPASE >2,800* 285* 86*   No results for input(s): AMMONIA in the last 168 hours. CBC: Recent Labs  Lab 07/15/24 0241 07/16/24 0530  WBC 10.1 8.4  HGB 13.2 11.9*  HCT 39.1 36.2  MCV 94.2 98.1  PLT 229 194   Cardiac Enzymes: No results for input(s): CKTOTAL, CKMB, CKMBINDEX, TROPONINI in the last 168 hours. BNP: Invalid input(s): POCBNP CBG: No results for input(s): GLUCAP in the last 168 hours. D-Dimer No results for input(s): DDIMER in the last 72 hours. Hgb A1c No results for input(s): HGBA1C in the last 72 hours. Lipid Profile Recent Labs    07/15/24 0241  CHOL 174  HDL 56  LDLCALC 79  TRIG 196*  196*  CHOLHDL 3.1   Thyroid  function studies No results for input(s): TSH, T4TOTAL, T3FREE, THYROIDAB in the last 72 hours.  Invalid input(s): FREET3 Anemia work up No results for input(s): VITAMINB12, FOLATE, FERRITIN, TIBC, IRON, RETICCTPCT in the last 72 hours. Urinalysis    Component Value Date/Time   COLORURINE YELLOW 07/15/2024 0507   APPEARANCEUR CLEAR 07/15/2024 0507   LABSPEC 1.027 07/15/2024 0507   PHURINE 5.0 07/15/2024 0507   GLUCOSEU NEGATIVE 07/15/2024 0507   GLUCOSEU NEGATIVE 03/06/2022 1601   HGBUR NEGATIVE 07/15/2024 0507   BILIRUBINUR NEGATIVE 07/15/2024 0507   KETONESUR 15 (A) 07/15/2024 0507   PROTEINUR NEGATIVE 07/15/2024 0507   UROBILINOGEN 0.2 03/06/2022 1601   NITRITE NEGATIVE 07/15/2024 0507   LEUKOCYTESUR NEGATIVE 07/15/2024 0507   Sepsis Labs Recent Labs  Lab 07/15/24 0241 07/16/24 0530  WBC 10.1 8.4   Microbiology No results found for this or any previous visit (from  the past 240 hours).   Time coordinating discharge: 38 minutes  SIGNED:  Almarie KANDICE Hoots, MD  [facility] 07/17/2024, 4:46 PM

## 2024-07-17 NOTE — Plan of Care (Signed)

## 2024-07-19 ENCOUNTER — Telehealth: Payer: Self-pay

## 2024-07-19 NOTE — Transitions of Care (Post Inpatient/ED Visit) (Signed)
   07/19/2024  Name: Latoya [name] MRN: [id number] DOB: Oct 22, 1976  Today's TOC FU Call Status: Today's TOC FU Call Status:: Successful TOC FU Call Completed TOC FU Call Complete Date: 07/19/24 Patient's Name and Date of Birth confirmed.  Transition Care Management Follow-up Telephone Call Date of Discharge: 07/17/24 Discharge Facility: [facility]) Type of Discharge: Inpatient Admission Primary Inpatient Discharge Diagnosis:: Acute alcoholic pancreatitis How have you been since you were released from the hospital?: Better Any questions or concerns?: No  Items Reviewed: Did you receive and understand the discharge instructions provided?: Yes Medications obtained,verified, and reconciled?: Yes (Medications Reviewed) Any new allergies since your discharge?: No Dietary orders reviewed?: NA Do you have support at home?: Yes  Medications Reviewed Today: Medications Reviewed Today     Reviewed by Lang Avelina PARAS, CMA (Certified Medical Assistant) on 07/19/24 at [phone]  Med List Status: <None>   Medication Order Taking? Sig Documenting Provider Last Dose Status Informant  Biotin 1 MG CAPS [id number] Yes Take 1 mg by mouth daily. [provider]  Active Self, Pharmacy Records  Magnesium 100 MG CAPS [id number] Yes Take 100 mg by mouth daily. [provider]  Active Self, Pharmacy Records  Multiple Vitamin (MULTIVITAMIN) tablet [id number] Yes Take 1 tablet by mouth daily. [provider]  Active Self, Pharmacy Records  omeprazole  (PRILOSEC) 20 MG capsule [id number] Yes Take 1 capsule (20 mg total) by mouth daily. Will Almarie MATSU, MD  Active   thiamine  (VITAMIN B-1) 100 MG tablet [id number] Yes Take 1 tablet (100 mg total) by mouth daily. Will Almarie MATSU, MD  Active   triamterene -hydrochlorothiazide  (MAXZIDE -25) 37.5-25 MG tablet [id number] Yes TAKE 1/2 TABLET BY MOUTH DAILY Webb, Padonda B, FNP  Active Self, Pharmacy Records  zolmitriptan  (ZOMIG ) 5 MG  tablet [id number] Yes Take 1 tab po for migraine headache. Do not repeat for 24 hours. Prentiss Annabella LABOR, NP  Active Self, Pharmacy Records           Med Note (CRUTHIS, CHLOE C   Thu Jul 15, 2024 12:57 PM)              Home Care and Equipment/Supplies: Were Home Health Services Ordered?: No Any new equipment or medical supplies ordered?: No  Functional Questionnaire: Do you need assistance with bathing/showering or dressing?: No Do you need assistance with meal preparation?: No Do you need assistance with eating?: No Do you have difficulty maintaining continence: No Do you need assistance with getting out of bed/getting out of a chair/moving?: No Do you have difficulty managing or taking your medications?: No  Follow up appointments reviewed: PCP Follow-up appointment confirmed?: No (pt declined) MD Provider Line Number:[phone] Given: Yes Specialist Hospital Follow-up appointment confirmed?: NA Do you need transportation to your follow-up appointment?: No Do you understand care options if your condition(s) worsen?: Yes-patient verbalized understanding    SIGNATURE Avelina Lang, CMA (AAMA)  [facility] [phone]

## 2024-08-06 ENCOUNTER — Telehealth: Payer: Self-pay | Admitting: Internal Medicine

## 2024-08-06 NOTE — Telephone Encounter (Signed)
 Copied from CRM [id number]. Topic: Appointments - Scheduling Inquiry for Clinic >> Aug 06, 2024 12:15 PM [place] J wrote: Reason for CRM: Patient would like to know if Dr. Geofm would make an exception to have her and her husband (MRN: [id number]) as new patients. They believe that there are no other providers that are a good fit like she is. The patient has seen Dr. Geofm for an acute visit but has never established.

## 2024-08-16 NOTE — Telephone Encounter (Signed)
 Please let her know I am sorry but I am not accepting new patients at this time.

## 2024-08-31 DIAGNOSIS — Z1231 Encounter for screening mammogram for malignant neoplasm of breast: Secondary | ICD-10-CM | POA: Diagnosis not present

## 2024-08-31 LAB — HM MAMMOGRAPHY

## 2024-11-01 ENCOUNTER — Telehealth: Payer: Self-pay | Admitting: Internal Medicine

## 2024-11-01 NOTE — Telephone Encounter (Signed)
 Ok to accept transfer

## 2024-11-01 NOTE — Telephone Encounter (Signed)
 There is no where in the chart that states this.   Copied from CRM [id number]. Topic: Appointments - Transfer of Care >> Nov 01, 2024 11:37 AM Latoya [name] wrote: Pt is requesting to transfer FROM: Latoya [name] Pt is requesting to transfer TO: Latoya [name] Reason for requested transfer: provider not working out for her It is the responsibility of the team the patient would like to transfer to (Dr. Ozell [name]) to reach out to the patient if for any reason this transfer is not acceptable.  Patient spoke with MD Latoya and she stated she will approve transfer [phone]

## 2024-11-11 ENCOUNTER — Ambulatory Visit (INDEPENDENT_AMBULATORY_CARE_PROVIDER_SITE_OTHER): Admitting: Family Medicine

## 2024-11-11 ENCOUNTER — Encounter: Payer: Self-pay | Admitting: Family Medicine

## 2024-11-11 VITALS — BP 136/80 | HR 75 | Temp 98.2°F | Ht 67.0 in | Wt 223.9 lb

## 2024-11-11 DIAGNOSIS — N951 Menopausal and female climacteric states: Secondary | ICD-10-CM | POA: Diagnosis not present

## 2024-11-11 DIAGNOSIS — I10 Essential (primary) hypertension: Secondary | ICD-10-CM

## 2024-11-11 DIAGNOSIS — E669 Obesity, unspecified: Secondary | ICD-10-CM

## 2024-11-11 MED ORDER — TRIAMTERENE-HCTZ 37.5-25 MG PO TABS
0.5000 | ORAL_TABLET | Freq: Every day | ORAL | 1 refills | Status: AC
Start: 2024-11-11 — End: ?

## 2024-11-11 MED ORDER — ZEPBOUND 2.5 MG/0.5ML ~~LOC~~ SOAJ: 2.5000 mg | SUBCUTANEOUS | 1 refills | Status: AC

## 2024-11-11 MED ORDER — ESTRADIOL 1 MG PO TABS: 1.0000 mg | ORAL_TABLET | Freq: Every day | ORAL | 3 refills | Status: AC

## 2024-11-11 NOTE — Progress Notes (Signed)
 Established Patient Office Visit  Subjective   Patient ID: Latoya [name], female    DOB: 1976/11/05  Age: 48 y.o. MRN: [id number]  Chief Complaint  Patient presents with   Establish Care    TOC from Dr Joshua    HPI  Discussed the use of AI scribe software for clinical note transcription with the patient, who gave verbal consent to proceed.  History of Present Illness   Latoya [name] is a 48 year old female who presents for a transition care visit and concerns about menopausal weight gain.  She experiences difficulty managing weight, particularly menopausal weight gain, with fluctuations of 10-15 pounds throughout the year. She had a gastric bypass in 2030 and maintained a healthy weight until decreased physical activity during the COVID-19 pandemic. She is currently at her heaviest since the surgery and feels 'miserable.'  She underwent a complete hysterectomy in her 30s and was on hormone replacement therapy until 2024. She experiences menopausal symptoms including 'brain fog' and severe night sweats. She previously took estradiol  but switched to over-the-counter estrogen due to blood pressure concerns.  She has hypertension, managed with Maxide, half a tablet daily. Her last visit was in January for blood work related to her medication.  She has a history of pancreatitis with episodes in May 2024 and July 2025, both following dietary overindulgence. No further issues have occurred since, and she has significantly reduced alcohol intake since 2025.  Her family history includes both parents with diabetes, but she reports no symptoms herself. She is cautious about her diet, focusing on low-fat foods and consuming healthy fats like nuts, seeds, salmon, and avocados.      Current Outpatient Medications  Medication Instructions   Biotin 1 mg, Daily   cetirizine (ZYRTEC) 10 mg, Daily   estradiol  (ESTRACE ) 1 mg, Oral, Daily   Magnesium 100 mg, Daily   Multiple Vitamin  (MULTIVITAMIN) tablet 1 tablet, Daily   thiamine  (VITAMIN B-1) 100 mg, Oral, Daily   triamterene -hydrochlorothiazide  (MAXZIDE -25) 37.5-25 MG tablet 0.5 tablets, Oral, Daily   Zepbound 2.5 mg, Subcutaneous, Weekly   zolmitriptan  (ZOMIG ) 5 MG tablet Take 1 tab po for migraine headache. Do not repeat for 24 hours.    Patient Active Problem List   Diagnosis Date Noted   Morbid obesity (HCC) 11/17/2024   Acute alcoholic pancreatitis 07/15/2024   Pancreatitis, alcoholic, acute 07/15/2024   Hypotension 05/22/2023   Fatty liver 05/22/2023   Acute pancreatitis 05/08/2023   Obesity (BMI 30-39.9) 05/08/2023   Deficiency anemia 12/05/2022   Alcohol use disorder, mild, abuse 08/07/2022   Primary insomnia 06/06/2022   Colon cancer screening 03/10/2022   Alcoholic hepatitis without ascites (HCC) 03/07/2022   Encounter for general adult medical examination with abnormal findings 03/06/2022   Primary hypertension 03/06/2022   GERD (gastroesophageal reflux disease) 10/11/2014   Migraines 10/11/2014     Review of Systems  All other systems reviewed and are negative.     Objective:     BP 136/80   Pulse 75   Temp 98.2 F (36.8 C) (Oral)   Ht 5' 7 (1.702 m)   Wt 223 lb 14.4 oz (101.6 kg)   LMP 12/30/2005   SpO2 100%   BMI 35.07 kg/m    Physical Exam Vitals reviewed.  Constitutional:      Appearance: Normal appearance. She is well-groomed. She is obese.  Cardiovascular:     Rate and Rhythm: Normal rate and regular rhythm.     Pulses: Normal pulses.  Heart sounds: S1 normal and S2 normal.  Pulmonary:     Effort: Pulmonary effort is normal.     Breath sounds: Normal breath sounds and air entry.  Musculoskeletal:     Right lower leg: No edema.     Left lower leg: No edema.  Neurological:     Mental Status: She is alert and oriented to person, place, and time. Mental status is at baseline.     Gait: Gait is intact.  Psychiatric:        Mood and Affect: Mood and affect  normal.        Speech: Speech normal.        Behavior: Behavior normal.        Judgment: Judgment normal.      No results found for any visits on 11/11/24.    The 10-year ASCVD risk score (Arnett DK, et al., 2019) is: 1.5%    Assessment & Plan:  Menopausal hot flushes -     Estradiol ; Take 1 tablet (1 mg total) by mouth daily.  Dispense: 90 tablet; Refill: 3  Morbid obesity (HCC) Assessment & Plan: BMI is 35 with 1 co morbid condition of HTN. I have had an extensive 30 minute conversation today with the patient about healthy eating habits, exercise, calorie and carb goals for sustainable and successful weight loss. I gave the patient caloric and protein daily intake values as well as described the importance of increasing fiber and water intake. I discussed weight loss medications that could be used in the treatment of this patient. Handouts on low carb eating were given to the patient.    Orders: -     Zepbound; Inject 2.5 mg into the skin once a week.  Dispense: 2 mL; Refill: 1  Primary hypertension -     Triamterene -HCTZ; Take 0.5 tablets by mouth daily.  Dispense: 45 tablet; Refill: 1   Assessment and Plan    Transition of Care Visit Routine transition of care visit to review medical history and address any new symptoms or problems. - Scheduled annual physical in May. - Reviewed medical history and addressed any new symptoms or problems.  Menopausal syndrome with weight gain and vasomotor symptoms Menopausal syndrome with significant weight gain and vasomotor symptoms, including night sweats and brain fog. Previously on hormone replacement therapy, discontinued due to concerns about blood pressure. Recent guidelines support the safety and benefits of estrogen therapy into the 60s, including reduced risk of heart disease and breast cancer. Discussed risks of blood clots and stroke with estrogen therapy, emphasizing the importance of monitoring blood pressure and being vigilant  for signs of blood clots. - Prescribed estradiol  1 mg daily. - Monitor blood pressure and watch for signs of blood clots.  Morbid obesity Significant weight gain since menopause and gastric bypass surgery. Discussed weight loss options, including GLP-1 receptor agonists like tirzepatide (Zepbound, Mounjaro) and semaglutide [facility]). Insurance covers GLP-1 receptor agonists until December 1st, after which coverage is limited to diabetic patients. Discussed cost options, including direct-to-consumer pharmacy and compounding pharmacies. Emphasized the importance of gradual dose escalation to minimize side effects. Discussed the small risk of pancreatitis with GLP-1 receptor agonists, though it is extremely rare. - Prescribed tirzepatide (Zepbound) starting at 2.5 mg, with gradual dose escalation. - Provided information on online compounding pharmacies and direct-to-consumer options. - Monitor for signs of pancreatitis and adjust treatment as necessary.  Essential hypertension Well-controlled on current medication regimen. Blood pressure today is 136/80 mmHg. - Continue current antihypertensive medication  regimen. - Sent refill for Maxzide  to pharmacy.  History of acute pancreatitis Two episodes of acute pancreatitis, most recently in July 2025. Discussed dietary modifications to reduce fat intake and the importance of monitoring for symptoms of pancreatitis, especially with the initiation of GLP-1 receptor agonists. - Continue dietary modifications to reduce fat intake. - Monitor for symptoms of pancreatitis, especially with new medication regimen.        Return in about 6 months (around 05/11/2025) for annual physical exam.    Heron CHRISTELLA Sharper, MD

## 2024-11-11 NOTE — Assessment & Plan Note (Signed)
I have had an extensive 30 minute conversation today with the patient about healthy eating habits, exercise, calorie and carb goals for sustainable and successful weight loss. I gave the patient caloric and protein daily intake values as well as described the importance of increasing fiber and water intake. I discussed weight loss medications that could be used in the treatment of this patient. Handouts on low carb eating were given to the patient.

## 2024-11-11 NOTE — Patient Instructions (Addendum)
 [email or web address]  [email or web address]

## 2024-11-15 ENCOUNTER — Encounter: Payer: Self-pay | Admitting: Family Medicine

## 2024-11-15 DIAGNOSIS — G43009 Migraine without aura, not intractable, without status migrainosus: Secondary | ICD-10-CM

## 2024-11-15 MED ORDER — ZOLMITRIPTAN 5 MG PO TABS
ORAL_TABLET | ORAL | 3 refills | Status: AC
Start: 2024-11-15 — End: ?

## 2024-11-17 NOTE — Assessment & Plan Note (Addendum)
 BMI is 35 with 1 co morbid condition of HTN. I have had an extensive 30 minute conversation today with the patient about healthy eating habits, exercise, calorie and carb goals for sustainable and successful weight loss. I gave the patient caloric and protein daily intake values as well as described the importance of increasing fiber and water intake. I discussed weight loss medications that could be used in the treatment of this patient. Handouts on low carb eating were given to the patient.

## 2024-11-23 DIAGNOSIS — S82842A Displaced bimalleolar fracture of left lower leg, initial encounter for closed fracture: Secondary | ICD-10-CM | POA: Diagnosis not present

## 2024-11-23 DIAGNOSIS — G5762 Lesion of plantar nerve, left lower limb: Secondary | ICD-10-CM | POA: Diagnosis not present

## 2024-11-23 DIAGNOSIS — G5763 Lesion of plantar nerve, bilateral lower limbs: Secondary | ICD-10-CM | POA: Diagnosis not present

## 2024-11-23 DIAGNOSIS — G5761 Lesion of plantar nerve, right lower limb: Secondary | ICD-10-CM | POA: Diagnosis not present

## 2024-12-06 ENCOUNTER — Other Ambulatory Visit: Payer: Self-pay | Admitting: Family Medicine

## 2024-12-09 DIAGNOSIS — E781 Pure hyperglyceridemia: Secondary | ICD-10-CM | POA: Diagnosis not present

## 2024-12-09 DIAGNOSIS — R635 Abnormal weight gain: Secondary | ICD-10-CM | POA: Diagnosis not present

## 2024-12-09 DIAGNOSIS — R7303 Prediabetes: Secondary | ICD-10-CM | POA: Diagnosis not present

## 2024-12-09 DIAGNOSIS — N951 Menopausal and female climacteric states: Secondary | ICD-10-CM | POA: Diagnosis not present

## 2024-12-09 DIAGNOSIS — E559 Vitamin D deficiency, unspecified: Secondary | ICD-10-CM | POA: Diagnosis not present

## 2024-12-09 DIAGNOSIS — R5383 Other fatigue: Secondary | ICD-10-CM | POA: Diagnosis not present

## 2024-12-12 DIAGNOSIS — M79672 Pain in left foot: Secondary | ICD-10-CM | POA: Diagnosis not present

## 2024-12-13 DIAGNOSIS — Z1331 Encounter for screening for depression: Secondary | ICD-10-CM | POA: Diagnosis not present

## 2024-12-13 DIAGNOSIS — I10 Essential (primary) hypertension: Secondary | ICD-10-CM | POA: Diagnosis not present

## 2024-12-16 DIAGNOSIS — I10 Essential (primary) hypertension: Secondary | ICD-10-CM | POA: Diagnosis not present

## 2024-12-20 DIAGNOSIS — E559 Vitamin D deficiency, unspecified: Secondary | ICD-10-CM | POA: Diagnosis not present

## 2024-12-20 DIAGNOSIS — R5383 Other fatigue: Secondary | ICD-10-CM | POA: Diagnosis not present

## 2024-12-21 DIAGNOSIS — S82842A Displaced bimalleolar fracture of left lower leg, initial encounter for closed fracture: Secondary | ICD-10-CM | POA: Diagnosis not present

## 2024-12-21 DIAGNOSIS — G5763 Lesion of plantar nerve, bilateral lower limbs: Secondary | ICD-10-CM | POA: Diagnosis not present

## 2024-12-29 DIAGNOSIS — R7303 Prediabetes: Secondary | ICD-10-CM | POA: Diagnosis not present

## 2025-01-11 ENCOUNTER — Other Ambulatory Visit: Payer: Self-pay | Admitting: Internal Medicine
